# Patient Record
Sex: Male | Born: 1975 | Race: Black or African American | Hispanic: No | Marital: Single | Smoking: Current every day smoker
Health system: Southern US, Community
[De-identification: ages and names within clinical notes are randomized; demographics above are authoritative.]

## PROBLEM LIST (undated history)

## (undated) DIAGNOSIS — R55 Syncope and collapse: Secondary | ICD-10-CM

---

## 2004-11-04 ENCOUNTER — Emergency Department (HOSPITAL_COMMUNITY): Admission: EM | Admit: 2004-11-04 | Discharge: 2004-11-04 | Payer: Self-pay | Admitting: Family Medicine

## 2005-03-05 ENCOUNTER — Emergency Department (HOSPITAL_COMMUNITY): Admission: EM | Admit: 2005-03-05 | Discharge: 2005-03-05 | Payer: Self-pay | Admitting: Emergency Medicine

## 2005-03-06 ENCOUNTER — Emergency Department (HOSPITAL_COMMUNITY): Admission: EM | Admit: 2005-03-06 | Discharge: 2005-03-07 | Payer: Self-pay | Admitting: *Deleted

## 2007-08-25 ENCOUNTER — Encounter: Admission: RE | Admit: 2007-08-25 | Discharge: 2007-08-25 | Payer: Self-pay | Admitting: Occupational Medicine

## 2017-07-18 ENCOUNTER — Observation Stay (HOSPITAL_COMMUNITY)
Admission: EM | Admit: 2017-07-18 | Discharge: 2017-07-19 | Disposition: A | Discharge status: Home / Self Care | Payer: PRIVATE HEALTH INSURANCE | Attending: Family Medicine | Admitting: Family Medicine

## 2017-07-18 ENCOUNTER — Encounter (HOSPITAL_COMMUNITY): Payer: Self-pay

## 2017-07-18 ENCOUNTER — Other Ambulatory Visit: Payer: Self-pay

## 2017-07-18 DIAGNOSIS — Z791 Long term (current) use of non-steroidal anti-inflammatories (NSAID): Secondary | ICD-10-CM | POA: Diagnosis not present

## 2017-07-18 DIAGNOSIS — K259 Gastric ulcer, unspecified as acute or chronic, without hemorrhage or perforation: Secondary | ICD-10-CM | POA: Insufficient documentation

## 2017-07-18 DIAGNOSIS — K269 Duodenal ulcer, unspecified as acute or chronic, without hemorrhage or perforation: Secondary | ICD-10-CM | POA: Diagnosis not present

## 2017-07-18 DIAGNOSIS — B9681 Helicobacter pylori [H. pylori] as the cause of diseases classified elsewhere: Secondary | ICD-10-CM | POA: Insufficient documentation

## 2017-07-18 DIAGNOSIS — F1010 Alcohol abuse, uncomplicated: Secondary | ICD-10-CM

## 2017-07-18 DIAGNOSIS — K5730 Diverticulosis of large intestine without perforation or abscess without bleeding: Secondary | ICD-10-CM | POA: Insufficient documentation

## 2017-07-18 DIAGNOSIS — K922 Gastrointestinal hemorrhage, unspecified: Principal | ICD-10-CM

## 2017-07-18 DIAGNOSIS — K648 Other hemorrhoids: Secondary | ICD-10-CM | POA: Insufficient documentation

## 2017-07-18 DIAGNOSIS — F17210 Nicotine dependence, cigarettes, uncomplicated: Secondary | ICD-10-CM | POA: Insufficient documentation

## 2017-07-18 DIAGNOSIS — K2970 Gastritis, unspecified, without bleeding: Secondary | ICD-10-CM | POA: Diagnosis not present

## 2017-07-18 DIAGNOSIS — K2950 Unspecified chronic gastritis without bleeding: Secondary | ICD-10-CM | POA: Insufficient documentation

## 2017-07-18 DIAGNOSIS — D649 Anemia, unspecified: Secondary | ICD-10-CM | POA: Diagnosis not present

## 2017-07-18 DIAGNOSIS — K449 Diaphragmatic hernia without obstruction or gangrene: Secondary | ICD-10-CM | POA: Insufficient documentation

## 2017-07-18 DIAGNOSIS — Z720 Tobacco use: Secondary | ICD-10-CM | POA: Diagnosis not present

## 2017-07-18 DIAGNOSIS — D509 Iron deficiency anemia, unspecified: Secondary | ICD-10-CM | POA: Diagnosis not present

## 2017-07-18 DIAGNOSIS — R55 Syncope and collapse: Secondary | ICD-10-CM | POA: Diagnosis present

## 2017-07-18 HISTORY — DX: Syncope and collapse: R55

## 2017-07-18 LAB — IRON AND TIBC
Iron: 12 ug/dL — ABNORMAL LOW (ref 45–182)
SATURATION RATIOS: 2 % — AB (ref 17.9–39.5)
TIBC: 567 ug/dL — ABNORMAL HIGH (ref 250–450)
UIBC: 555 ug/dL

## 2017-07-18 LAB — BASIC METABOLIC PANEL
ANION GAP: 16 — AB (ref 5–15)
BUN: 6 mg/dL (ref 6–20)
CO2: 19 mmol/L — ABNORMAL LOW (ref 22–32)
Calcium: 8.7 mg/dL — ABNORMAL LOW (ref 8.9–10.3)
Chloride: 105 mmol/L (ref 101–111)
Creatinine, Ser: 0.87 mg/dL (ref 0.61–1.24)
GFR calc Af Amer: >60 mL/min (ref >60)
Glucose, Bld: 111 mg/dL — ABNORMAL HIGH (ref 65–99)
Potassium: 3.6 mmol/L (ref 3.5–5.1)
SODIUM: 140 mmol/L (ref 135–145)

## 2017-07-18 LAB — RETICULOCYTES
RBC.: 4.19 MIL/uL — AB (ref 4.22–5.81)
Retic Count, Absolute: 41.9 10*3/uL (ref 19.0–186.0)
Retic Ct Pct: 1 % (ref 0.4–3.1)

## 2017-07-18 LAB — URINALYSIS, ROUTINE W REFLEX MICROSCOPIC
BACTERIA UA: NONE SEEN
BILIRUBIN URINE: NEGATIVE
GLUCOSE, UA: NEGATIVE mg/dL
Hgb urine dipstick: NEGATIVE
KETONES UR: NEGATIVE mg/dL
LEUKOCYTES UA: NEGATIVE
NITRITE: NEGATIVE
Protein, ur: 100 mg/dL — AB
Specific Gravity, Urine: 1.015 (ref 1.005–1.030)
pH: 5 (ref 5.0–8.0)

## 2017-07-18 LAB — POC OCCULT BLOOD, ED: Fecal Occult Bld: POSITIVE — AB

## 2017-07-18 LAB — RAPID URINE DRUG SCREEN, HOSP PERFORMED
Amphetamines: NOT DETECTED
Barbiturates: NOT DETECTED
Benzodiazepines: NOT DETECTED
COCAINE: NOT DETECTED
OPIATES: NOT DETECTED
TETRAHYDROCANNABINOL: NOT DETECTED

## 2017-07-18 LAB — CBC
HEMATOCRIT: 25.2 % — AB (ref 39.0–52.0)
HEMOGLOBIN: 7.3 g/dL — AB (ref 13.0–17.0)
MCH: 18.1 pg — ABNORMAL LOW (ref 26.0–34.0)
MCHC: 29 g/dL — ABNORMAL LOW (ref 30.0–36.0)
MCV: 62.4 fL — ABNORMAL LOW (ref 78.0–100.0)
Platelets: 252 10*3/uL (ref 150–400)
RBC: 4.04 MIL/uL — ABNORMAL LOW (ref 4.22–5.81)
RDW: 19.9 % — ABNORMAL HIGH (ref 11.5–15.5)
WBC: 5.5 10*3/uL (ref 4.0–10.5)

## 2017-07-18 LAB — PREPARE RBC (CROSSMATCH)

## 2017-07-18 LAB — HEMOGLOBIN AND HEMATOCRIT, BLOOD
HCT: 26.3 % — ABNORMAL LOW (ref 39.0–52.0)
Hemoglobin: 8 g/dL — ABNORMAL LOW (ref 13.0–17.0)

## 2017-07-18 LAB — PROTIME-INR
INR: 1.05
Prothrombin Time: 13.6 seconds (ref 11.4–15.2)

## 2017-07-18 LAB — VITAMIN B12: Vitamin B-12: 357 pg/mL (ref 180–914)

## 2017-07-18 LAB — FOLATE: Folate: 13.2 ng/mL (ref >5.9)

## 2017-07-18 LAB — FERRITIN: Ferritin: 5 ng/mL — ABNORMAL LOW (ref 24–336)

## 2017-07-18 MED ORDER — ACETAMINOPHEN 325 MG PO TABS: 650.0000 mg | ORAL_TABLET | Freq: Four times a day (QID) | ORAL | Status: DC | PRN

## 2017-07-18 MED ORDER — SODIUM CHLORIDE 0.9 % IV SOLN
INTRAVENOUS | Status: DC
  Administered 2017-07-18: 22:00:00 via INTRAVENOUS

## 2017-07-18 MED ORDER — PEG-KCL-NACL-NASULF-NA ASC-C 100 G PO SOLR
0.5000 | Freq: Once | ORAL | Status: AC
  Administered 2017-07-18: 100 g via ORAL
  Filled 2017-07-18: qty 1

## 2017-07-18 MED ORDER — THIAMINE HCL 100 MG/ML IJ SOLN: 100.0000 mg | Freq: Every day | INTRAMUSCULAR | Status: DC

## 2017-07-18 MED ORDER — PEG-KCL-NACL-NASULF-NA ASC-C 100 G PO SOLR
0.5000 | Freq: Once | ORAL | Status: AC
  Administered 2017-07-19: 100 g via ORAL
  Filled 2017-07-18: qty 1

## 2017-07-18 MED ORDER — VITAMIN B-1 100 MG PO TABS
100.0000 mg | ORAL_TABLET | Freq: Every day | ORAL | Status: DC
  Administered 2017-07-18 – 2017-07-19 (×2): 100 mg via ORAL
  Filled 2017-07-18 (×2): qty 1

## 2017-07-18 MED ORDER — SODIUM CHLORIDE 0.9 % IV SOLN: Freq: Once | INTRAVENOUS | Status: DC

## 2017-07-18 MED ORDER — FOLIC ACID 1 MG PO TABS
1.0000 mg | ORAL_TABLET | Freq: Every day | ORAL | Status: DC
  Administered 2017-07-18 – 2017-07-19 (×2): 1 mg via ORAL
  Filled 2017-07-18 (×2): qty 1

## 2017-07-18 MED ORDER — LORAZEPAM 2 MG/ML IJ SOLN: 1.0000 mg | Freq: Four times a day (QID) | INTRAMUSCULAR | Status: DC | PRN

## 2017-07-18 MED ORDER — PEG-KCL-NACL-NASULF-NA ASC-C 100 G PO SOLR: 1.0000 | Freq: Once | ORAL | Status: DC

## 2017-07-18 MED ORDER — LORAZEPAM 1 MG PO TABS: 1.0000 mg | ORAL_TABLET | Freq: Four times a day (QID) | ORAL | Status: DC | PRN

## 2017-07-18 MED ORDER — NICOTINE 14 MG/24HR TD PT24
14.0000 mg | MEDICATED_PATCH | Freq: Every day | TRANSDERMAL | Status: DC
  Administered 2017-07-19 (×2): 14 mg via TRANSDERMAL
  Filled 2017-07-18 (×2): qty 1

## 2017-07-18 MED ORDER — ACETAMINOPHEN 650 MG RE SUPP: 650.0000 mg | Freq: Four times a day (QID) | RECTAL | Status: DC | PRN

## 2017-07-18 MED ORDER — ADULT MULTIVITAMIN W/MINERALS CH
1.0000 | ORAL_TABLET | Freq: Every day | ORAL | Status: DC
  Administered 2017-07-18 – 2017-07-19 (×2): 1 via ORAL
  Filled 2017-07-18 (×2): qty 1

## 2017-07-18 MED ORDER — ONDANSETRON HCL 4 MG PO TABS: 4.0000 mg | ORAL_TABLET | Freq: Four times a day (QID) | ORAL | Status: DC | PRN

## 2017-07-18 MED ORDER — ONDANSETRON HCL 4 MG/2ML IJ SOLN: 4.0000 mg | Freq: Four times a day (QID) | INTRAMUSCULAR | Status: DC | PRN

## 2017-07-18 NOTE — ED Provider Notes (Signed)
[facility] Provider Note   CSN: [id number] Arrival date & time: 07/18/17  [id number]     History   Chief Complaint Chief Complaint  Patient presents with  . Nausea  . Dizziness    HPI Shane Hicks is a 42 y.o. male.  The history is provided by the patient and medical records. No language interpreter was used.  Dizziness  Associated symptoms: nausea and weakness   Associated symptoms: no diarrhea, no headaches and no vomiting    Shane Hicks is a 42 y.o. male  with no known PMH who presents to the Emergency Department complaining of lightheadedness which began this morning. Patient states that he stood up and got very weak and lightheaded. He thought he was going to faint, so he sat back down. Associated with nausea which has now resolved. Denies abdominal pain or vomiting. Still feels "like something's not right" and endorses fatigue and lightheadedness but denies any weakness / dizziness at the current moment.  He does report noticing bright red blood in the stool.  He states that this is been very intermittent, maybe once every 2-3 weeks.  He does not feel as if he has had any blood in the stool this week. No hx of similar sxs. Denies hx of anemia or any known medical problems. Does not currently have PCP. No fever, chills, headache, neck pain, chest pain, shortness of breath. No medications taken prior to arrival for symptoms.    Past Medical History:  Diagnosis Date  . Pre-syncope     Patient Active Problem List   Diagnosis Date Noted  . Acute anemia 07/18/2017  . Pre-syncope     History reviewed. No pertinent surgical history.      Home Medications    Prior to Admission medications   Not on File    Family History No family history on file.  Social History Social History   Tobacco Use  . Smoking status: Current Every Day Smoker    Packs/day: 1.00    Types: Cigarettes  . Smokeless tobacco: Never Used  Substance Use  Topics  . Alcohol use: Yes    Comment: pt reports last use last night   . Drug use: Not Currently     Allergies   Patient has no allergy information on record.   Review of Systems Review of Systems  Constitutional: Positive for fatigue.  Gastrointestinal: Positive for nausea. Negative for abdominal pain, diarrhea and vomiting.  Neurological: Positive for weakness and light-headedness. Negative for syncope, numbness and headaches.  All other systems reviewed and are negative.    Physical Exam Updated Vital Signs BP (!) 141/88 (BP Location: Right Arm)   Pulse 85   Temp 98.1 F (36.7 C) (Oral)   Resp 16   Ht 6' (1.829 m)   Wt 80.3 kg (177 lb)   SpO2 100%   BMI 24.01 kg/m   Physical Exam  Constitutional: He is oriented to person, place, and time. He appears well-developed and well-nourished. No distress.  HENT:  Head: Normocephalic and atraumatic.  Cardiovascular: Normal rate, regular rhythm and normal heart sounds.  No murmur heard. Pulmonary/Chest: Effort normal and breath sounds normal. No respiratory distress.  Abdominal: Soft. He exhibits no distension.  No abdominal tenderness.  Genitourinary:  Genitourinary Comments: Guaiac positive.  Bright red blood on rectal exam.  Musculoskeletal: Normal range of motion.  Neurological: He is alert and oriented to person, place, and time.  Speech clear and goal oriented. CN 2-12 grossly  intact. Normal finger-to-nose and rapid alternating movements. No drift. Strength and sensation intact. Steady gait.  Skin: Skin is warm and dry.  Nursing note and vitals reviewed.    ED Treatments / Results  Labs (all labs ordered are listed, but only abnormal results are displayed) Labs Reviewed  BASIC METABOLIC PANEL - Abnormal; Notable for the following components:      Result Value   CO2 19 (*)    Glucose, Bld 111 (*)    Calcium 8.7 (*)    Anion gap 16 (*)    All other components within normal limits  CBC - Abnormal; Notable  for the following components:   RBC 4.04 (*)    Hemoglobin 7.3 (*)    HCT 25.2 (*)    MCV 62.4 (*)    MCH 18.1 (*)    MCHC 29.0 (*)    RDW 19.9 (*)    All other components within normal limits  URINALYSIS, ROUTINE W REFLEX MICROSCOPIC - Abnormal; Notable for the following components:   APPearance HAZY (*)    Protein, ur 100 (*)    Squamous Epithelial / LPF 0-5 (*)    All other components within normal limits  RETICULOCYTES - Abnormal; Notable for the following components:   RBC. 4.19 (*)    All other components within normal limits  POC OCCULT BLOOD, ED - Abnormal; Notable for the following components:   Fecal Occult Bld POSITIVE (*)    All other components within normal limits  VITAMIN B12  FOLATE  IRON AND TIBC  FERRITIN  TYPE AND SCREEN    EKG EKG Interpretation  Date/Time:  Saturday July 18 2017 10:10:56 EDT Ventricular Rate:  83 PR Interval:  150 QRS Duration: 92 QT Interval:  384 QTC Calculation: 451 R Axis:   70 Text Interpretation:  Normal sinus rhythm Right atrial enlargement Minimal voltage criteria for LVH, may be normal variant Borderline ECG No significant change since last tracing Confirmed by Alvira Monday ([id number]) on 07/18/2017 1:47:55 PM   Radiology No results found.  Procedures Procedures (including critical care time)  Medications Ordered in ED Medications - No data to display   Initial Impression / Assessment and Plan / ED Course  I have reviewed the triage vital signs and the nursing notes.  Pertinent labs & imaging results that were available during my care of the patient were reviewed by me and considered in my medical decision making (see chart for details).    Shane Hicks is a 42 y.o. male who presents to ED for near syncopal episode this morning upon awakening.  He felt very lightheaded this morning when he got up.  He sat back down and symptoms improved gradually.  He still feels as if something is not right and somewhat  lightheaded, but overall improved.  Afebrile and hemodynamically stable.  He did have bright red blood on rectal examination which was guaiac positive.  He notes history of bright red blood in the stools intermittently, maybe once every 2-3 weeks.  He did not realize he was having active bleeding at this time.  Labs reviewed and notable for hemoglobin of 7.3.  No baseline for comparison.  No history of similar per patient.  GI, Dr. Adela Lank, consulted. GI to evaluate and see in consultation.  Hospitalist consulted who will admit.  Patient discussed with Dr. Dalene Seltzer who agrees with treatment plan.    Final Clinical Impressions(s) / ED Diagnoses   Final diagnoses:  Symptomatic anemia    ED Discharge  Orders    None       Yanna Leaks, Chase Picket, PA-C 07/18/17 1444    Alvira Monday, MD 07/18/17 2328

## 2017-07-18 NOTE — ED Notes (Signed)
tele

## 2017-07-18 NOTE — ED Notes (Signed)
Attempted to call report to floor 

## 2017-07-18 NOTE — H&P (Signed)
History and Physical    Shane Hicks ZOX:[id number] DOB: 02-May-1975 DOA: 07/18/2017  PCP: Patient, No Pcp Per Patient coming from: home  Chief Complaint: dizzy near syncope  HPI: Shane Hicks is a 42 y.o. male with medical history significant  For EtOH use tobacco use chronic headaches for which he uses goody powders presents emergency Department chief complaint dizziness and near syncope. Initial evaluation reveals hemoglobin of 7.2 and heme positive stool. [facility] are asked to admit  Information is obtained from the patient and the chart. He reports being in his usual state of health until he awakened today and upon getting out of bed he became "dizzy". He denies headache chest pain shortness of breath palpitations. Reports he felt like "he was given a pass out". Denies any abdominal pain nausea vomiting diarrhea constipation melena bright red blood per rectum. He does report he takes  powders through 4 times a week for frequent headaches and he drinks at least 6 12 ounce beers a day. He denies any fever chills recent travel or sick contacts. He denies any unintentional weight loss. He states he's never had a colonoscopy or an endoscopy. No history of peptic ulcer disease.   ED Course: in the emergency department he's afebrile mildly orthostatic not hypoxic.  Review of Systems: As per HPI otherwise all other systems reviewed and are negative.   Ambulatory Status:ambulates independently is independent with ADLs  Past Medical History:  Diagnosis Date  . Pre-syncope     History reviewed. No pertinent surgical history.  Social History   Socioeconomic History  . Marital status: Single    Spouse name: Not on file  . Number of children: Not on file  . Years of education: Not on file  . Highest education level: Not on file  Occupational History  . Not on file  Social Needs  . Financial resource strain: Not on file  . Food insecurity:    Worry: Not on file    Inability:  Not on file  . Transportation needs:    Medical: Not on file    Non-medical: Not on file  Tobacco Use  . Smoking status: Current Every Day Smoker    Packs/day: 1.00    Types: Cigarettes  . Smokeless tobacco: Never Used  Substance and Sexual Activity  . Alcohol use: Yes    Comment: pt reports last use last night   . Drug use: Not Currently  . Sexual activity: Not on file  Lifestyle  . Physical activity:    Days per week: Not on file    Minutes per session: Not on file  . Stress: Not on file  Relationships  . Social connections:    Talks on phone: Not on file    Gets together: Not on file    Attends religious service: Not on file    Active member of club or organization: Not on file    Attends meetings of clubs or organizations: Not on file    Relationship status: Not on file  . Intimate partner violence:    Fear of current or ex partner: Not on file    Emotionally abused: Not on file    Physically abused: Not on file    Forced sexual activity: Not on file  Other Topics Concern  . Not on file  Social History Narrative  . Not on file    No Known Allergies  Family History  Problem Relation Age of Onset  . Hypertension Mother  Prior to Admission medications   Medication Sig Start Date End Date Taking? Authorizing Provider  Aspirin-Salicylamide-Caffeine (BC HEADACHE POWDER PO) Take 2 packets by mouth 2 (two) times daily as needed (pain/headache).   Yes [provider]    Physical Exam: Vitals:   07/18/17 1013 07/18/17 1016  BP: (!) 141/88   Pulse: 85   Resp: 16   Temp: 98.1 F (36.7 C)   TempSrc: Oral   SpO2: 100%   Weight: 80.3 kg (177 lb)   Height: 6' (1.829 m) 6' (1.829 m)     General:  Appears calm and comfortable Eyes:  PERRL, EOMI, normal lids, iris ENT:  grossly normal hearing, lips & tongue, mucous membranes of his mouth are moist and pink Neck:  no LAD, masses or thyromegaly Cardiovascular:  RRR, no m/r/g. No LE edema.  Respiratory:   CTA bilaterally, no w/r/r. Normal respiratory effort. Abdomen:  soft, ntnd, ositive bowel sounds throughout no guarding or rebounding Skin:  no rash or induration seen on limited exam  Musculoskeletal:  grossly normal tone BUE/BLE, good ROM, no bony abnormality Psychiatric:  grossly normal mood and affect, speech fluent and appropriate, AOx3 Neurologic:  CN 2-12 grossly intact, moves all extremities in coordinated fashion, sensation intact  Labs on Admission: I have personally reviewed following labs and imaging studies  CBC: Recent Labs  Lab 07/18/17 1018  WBC 5.5  HGB 7.3*  HCT 25.2*  MCV 62.4*  PLT 252   Basic Metabolic Panel: Recent Labs  Lab 07/18/17 1018  NA 140  K 3.6  CL 105  CO2 19*  GLUCOSE 111*  BUN 6  CREATININE 0.87  CALCIUM 8.7*   GFR: Estimated Creatinine Clearance: 121.4 mL/min (by C-G formula based on SCr of 0.87 mg/dL). Liver Function Tests: No results for input(s): AST, ALT, ALKPHOS, BILITOT, PROT, ALBUMIN in the last 168 hours. No results for input(s): LIPASE, AMYLASE in the last 168 hours. No results for input(s): AMMONIA in the last 168 hours. Coagulation Profile: Recent Labs  Lab 07/18/17 1419  INR 1.05   Cardiac Enzymes: No results for input(s): CKTOTAL, CKMB, CKMBINDEX, TROPONINI in the last 168 hours. BNP (last 3 results) No results for input(s): PROBNP in the last 8760 hours. HbA1C: No results for input(s): HGBA1C in the last 72 hours. CBG: No results for input(s): GLUCAP in the last 168 hours. Lipid Profile: No results for input(s): CHOL, HDL, LDLCALC, TRIG, CHOLHDL, LDLDIRECT in the last 72 hours. Thyroid Function Tests: No results for input(s): TSH, T4TOTAL, FREET4, T3FREE, THYROIDAB in the last 72 hours. Anemia Panel: Recent Labs    07/18/17 1220  VITAMINB12 357  FOLATE 13.2  FERRITIN 5*  TIBC 567*  IRON 12*  RETICCTPCT 1.0   Urine analysis:    Component Value Date/Time   COLORURINE YELLOW 07/18/2017 1018    APPEARANCEUR HAZY (A) 07/18/2017 1018   LABSPEC 1.015 07/18/2017 1018   PHURINE 5.0 07/18/2017 1018   GLUCOSEU NEGATIVE 07/18/2017 1018   HGBUR NEGATIVE 07/18/2017 1018   BILIRUBINUR NEGATIVE 07/18/2017 1018   KETONESUR NEGATIVE 07/18/2017 1018   PROTEINUR 100 (A) 07/18/2017 1018   NITRITE NEGATIVE 07/18/2017 1018   LEUKOCYTESUR NEGATIVE 07/18/2017 1018    Creatinine Clearance: Estimated Creatinine Clearance: 121.4 mL/min (by C-G formula based on SCr of 0.87 mg/dL).  Sepsis Labs: @LABRCNTIP (procalcitonin:4,lacticidven:4) )No results found for this or any previous visit (from the past 240 hour(s)).   Radiological Exams on Admission: No results found.  EKG: Normal sinus rhythm Right atrial enlargement Minimal  voltage criteria for LVH, may be normal variant   Assessment/Plan Principal Problem:   GI bleed Active Problems:   Pre-syncope   Symptomatic anemia   Tobacco use   ETOH abuse   #1. GI bleed. Patient takes Marlin CanaryGoody powders 2-3 times a week on a regular basis also daily EtOH consumption. Heme positive stool. Hemoglobin 7.3. Evaluated by gastroenterology who opined patient needs to be evaluated for erosive disease. -admit -clear liquids -eGD and colonoscopy per GI tomorrow -Transfuse 1 unit packed red blood cells  #2. Symptomatic anemia. Hemoglobin 7.2. Patient with severe iron deficiency anemia and heme positive stools. -Transfuse 1 unit of packed red blood cells -Serial CBCs -See #1  #3. Presyncope. Likely related to above. Patient mildly orthostatic in the emergency department as well. EKG as noted above. -gentle IV fluids -Monitor on telemetry -Orthostatic vital signs tomorrow -Transfuse as noted above  #4. EtOH use. Patient states he drinks at least 6 beers a day. -CIWA protocol -No signs symptoms of withdrawal  #5. Tobacco use -Cessation counseling offered    DVT prophylaxis:  scd Code Status: full  Family Communication: wife at bedside  Disposition  Plan: home  Consults called: [facility]  Admission status: inpatient    Gwenyth BenderBLACK,Alauna Hayden M MD [facility]  If 7PM-7AM, please contact night-coverage [email or web address] Password [email or web address]  07/18/2017, 4:13 PM

## 2017-07-18 NOTE — ED Triage Notes (Signed)
Pt reports he woke up with dizziness and nausea this morning and "something not feeling right. Pt also states that he removed a tick yesterday .

## 2017-07-18 NOTE — Consult Note (Signed)
Referring Provider: [facility]   Primary Care Physician:  Patient, No Pcp Per Primary Gastroenterologist:   None. Unassigned  Reason for Consultation:  Rectal bleeding, anemia    ASSESSMENT AND PLAN:    97.  42 year old male with severe iron deficiency anemia /heme positive stool.  He takes [place] powders 2-3 times a week on a regular basis and has done so for years.  Need to evaluate for NSAID induced erosive disease / peptic ulcer disease.  Additionally, patient has had a few episodes of low volume rectal bleeding with bowel movements over the last 2 to 3 months.  The degree of anemia is out of proportion to the amount of described rectal bleeding.  Bleeding could be from internal hemorrhoids but certainly patient needs a colonoscopy to rule out neoplasm -Patient will be scheduled for an EGD and colonoscopy with possible biopsies.  The risk and benefits of each procedure were explained to the patient and he agrees to proceed.  Will prep patient tonight for a.m. procedures -Blood transfusion has already been ordered  2. ETOH use / ? Overuse. Drinks 1/2 of a 12 pk daily.  -check LFTs next lab draw  HPI: Shane Hicks is a 42 y.o. male who presented to the ED today with dizziness.  He was hemodynamically stable but labs have identified severe iron deficiency anemia.  Patient reported rectal bleeding over the last couple of months.  Upon further questioning he has had maybe 3 episodes of what sounds like low-volume painless rectal bleeding with defecation in the absence of constipation or diarrhea.  His bowel movements are otherwise at baseline.  He has no abdominal pain.  He reported some nausea this morning but this is not been his norm.  He denies weight loss.  Patient has taken Goody powders 2-3 times a week for years for headaches.  No other NSAIDs.  No history of peptic ulcer disease.  He has no chronic GI complaints or illnesses.  No family history of stomach cancer or colon cancer.     Patient smokes tobacco.  He drinks half of a 12 pack daily.  No illicit drug use.  No marijuana use.  Evaluation: WBC 5.5, hemoglobin 7.3, platelets 252, ferritin 5. Stool heme positive     Past Medical History:  Diagnosis Date  . Pre-syncope     History reviewed. No pertinent surgical history.  Prior to Admission medications   Medication Sig Start Date End Date Taking? Authorizing Provider  Aspirin-Salicylamide-Caffeine (BC HEADACHE POWDER PO) Take 2 packets by mouth 2 (two) times daily as needed (pain/headache).   Yes [provider]    Current Facility-Administered Medications  Medication Dose Route Frequency Provider Last Rate Last Dose  . 0.9 %  sodium chloride infusion   Intravenous Continuous Black, Karen M, NP      . 0.9 %  sodium chloride infusion   Intravenous Once Black, Lesle Chris, NP      . acetaminophen (TYLENOL) tablet 650 mg  650 mg Oral Q6H PRN Gwenyth Bender, NP       Or  . acetaminophen (TYLENOL) suppository 650 mg  650 mg Rectal Q6H PRN Gwenyth Bender, NP      . ondansetron [facility]) tablet 4 mg  4 mg Oral Q6H PRN Gwenyth Bender, NP       Or  . ondansetron [facility]) injection 4 mg  4 mg Intravenous Q6H PRN Gwenyth Bender, NP       Current Outpatient Medications  Medication Sig Dispense Refill  . Aspirin-Salicylamide-Caffeine (BC HEADACHE POWDER PO) Take 2 packets by mouth 2 (two) times daily as needed (pain/headache).      Allergies as of 07/18/2017  . (No Known Allergies)    No family history on file.  Social History   Socioeconomic History  . Marital status: Single    Spouse name: Not on file  . Number of children: Not on file  . Years of education: Not on file  . Highest education level: Not on file  Occupational History  . Not on file  Social Needs  . Financial resource strain: Not on file  . Food insecurity:    Worry: Not on file    Inability: Not on file  . Transportation needs:    Medical: Not on file    Non-medical: Not on  file  Tobacco Use  . Smoking status: Current Every Day Smoker    Packs/day: 1.00    Types: Cigarettes  . Smokeless tobacco: Never Used  Substance and Sexual Activity  . Alcohol use: Yes    Comment: pt reports last use last night   . Drug use: Not Currently  . Sexual activity: Not on file  Lifestyle  . Physical activity:    Days per week: Not on file    Minutes per session: Not on file  . Stress: Not on file  Relationships  . Social connections:    Talks on phone: Not on file    Gets together: Not on file    Attends religious service: Not on file    Active member of club or organization: Not on file    Attends meetings of clubs or organizations: Not on file    Relationship status: Not on file  . Intimate partner violence:    Fear of current or ex partner: Not on file    Emotionally abused: Not on file    Physically abused: Not on file    Forced sexual activity: Not on file  Other Topics Concern  . Not on file  Social History Narrative  . Not on file    Review of Systems: All systems reviewed and negative except where noted in HPI.  Physical Exam: Vital signs in last 24 hours: Temp:  [98.1 F (36.7 C)] 98.1 F (36.7 C) (04/20 1013) Pulse Rate:  [85] 85 (04/20 1013) Resp:  [16] 16 (04/20 1013) BP: (141)/(88) 141/88 (04/20 1013) SpO2:  [100 %] 100 % (04/20 1013) Weight:  [177 lb (80.3 kg)] 177 lb (80.3 kg) (04/20 1013)   General:   Alert, thin black male in NAD Psych:  Pleasant, cooperative. Normal mood and affect. Eyes:  Pupils equal, sclera clear, no icterus.   Conjunctiva pink. Ears:  Normal auditory acuity. Nose:  No deformity, discharge,  or lesions. Neck:  Supple; no masses Lungs:  Clear throughout to auscultation.   No wheezes, crackles, or rhonchi.  Heart:  Regular rate and rhythm; no murmurs, no edema Abdomen:  Soft, non-distended, nontender, BS active, no palp mass    Rectal:  Deferred until time of colonoscopy Msk:  Symmetrical without gross  deformities. . Neurologic:  Alert and  oriented x4;  grossly normal neurologically. Skin:  Intact without significant lesions or rashes..   Intake/Output from previous day: No intake/output data recorded. Intake/Output this shift: No intake/output data recorded.  Lab Results: Recent Labs    07/18/17 1018  WBC 5.5  HGB 7.3*  HCT 25.2*  PLT 252   BMET Recent Labs  07/18/17 1018  NA 140  K 3.6  CL 105  CO2 19*  GLUCOSE 111*  BUN 6  CREATININE 0.87  CALCIUM 8.7*    Studies/Results: No results found.   Willette Cluster, NP-C @  07/18/2017, 2:57 PM  Pager number [phone]

## 2017-07-18 NOTE — ED Notes (Signed)
Ordered dinner tray.  

## 2017-07-18 NOTE — ED Notes (Signed)
Pt ambulated to restroom. Gait steady.  

## 2017-07-18 NOTE — ED Triage Notes (Signed)
This Clinical research associatewriter observed PT walking down hall. PT asked where he was going. PT reported he had to go to his car.

## 2017-07-18 NOTE — H&P (View-Only) (Signed)
Referring Provider: [facility]   Primary Care Physician:  Patient, No Pcp Per Primary Gastroenterologist:   None. Unassigned  Reason for Consultation:  Rectal bleeding, anemia    ASSESSMENT AND PLAN:    97.  42 year old male with severe iron deficiency anemia /heme positive stool.  He takes [place] powders 2-3 times a week on a regular basis and has done so for years.  Need to evaluate for NSAID induced erosive disease / peptic ulcer disease.  Additionally, patient has had a few episodes of low volume rectal bleeding with bowel movements over the last 2 to 3 months.  The degree of anemia is out of proportion to the amount of described rectal bleeding.  Bleeding could be from internal hemorrhoids but certainly patient needs a colonoscopy to rule out neoplasm -Patient will be scheduled for an EGD and colonoscopy with possible biopsies.  The risk and benefits of each procedure were explained to the patient and he agrees to proceed.  Will prep patient tonight for a.m. procedures -Blood transfusion has already been ordered  2. ETOH use / ? Overuse. Drinks 1/2 of a 12 pk daily.  -check LFTs next lab draw  HPI: Catalino Plascencia is a 42 y.o. male who presented to the ED today with dizziness.  He was hemodynamically stable but labs have identified severe iron deficiency anemia.  Patient reported rectal bleeding over the last couple of months.  Upon further questioning he has had maybe 3 episodes of what sounds like low-volume painless rectal bleeding with defecation in the absence of constipation or diarrhea.  His bowel movements are otherwise at baseline.  He has no abdominal pain.  He reported some nausea this morning but this is not been his norm.  He denies weight loss.  Patient has taken Goody powders 2-3 times a week for years for headaches.  No other NSAIDs.  No history of peptic ulcer disease.  He has no chronic GI complaints or illnesses.  No family history of stomach cancer or colon cancer.     Patient smokes tobacco.  He drinks half of a 12 pack daily.  No illicit drug use.  No marijuana use.  Evaluation: WBC 5.5, hemoglobin 7.3, platelets 252, ferritin 5. Stool heme positive     Past Medical History:  Diagnosis Date  . Pre-syncope     History reviewed. No pertinent surgical history.  Prior to Admission medications   Medication Sig Start Date End Date Taking? Authorizing Provider  Aspirin-Salicylamide-Caffeine (BC HEADACHE POWDER PO) Take 2 packets by mouth 2 (two) times daily as needed (pain/headache).   Yes [provider]    Current Facility-Administered Medications  Medication Dose Route Frequency Provider Last Rate Last Dose  . 0.9 %  sodium chloride infusion   Intravenous Continuous Black, Karen M, NP      . 0.9 %  sodium chloride infusion   Intravenous Once Black, Lesle Chris, NP      . acetaminophen (TYLENOL) tablet 650 mg  650 mg Oral Q6H PRN Gwenyth Bender, NP       Or  . acetaminophen (TYLENOL) suppository 650 mg  650 mg Rectal Q6H PRN Gwenyth Bender, NP      . ondansetron [facility]) tablet 4 mg  4 mg Oral Q6H PRN Gwenyth Bender, NP       Or  . ondansetron [facility]) injection 4 mg  4 mg Intravenous Q6H PRN Gwenyth Bender, NP       Current Outpatient Medications  Medication Sig Dispense Refill  . Aspirin-Salicylamide-Caffeine (BC HEADACHE POWDER PO) Take 2 packets by mouth 2 (two) times daily as needed (pain/headache).      Allergies as of 07/18/2017  . (No Known Allergies)    No family history on file.  Social History   Socioeconomic History  . Marital status: Single    Spouse name: Not on file  . Number of children: Not on file  . Years of education: Not on file  . Highest education level: Not on file  Occupational History  . Not on file  Social Needs  . Financial resource strain: Not on file  . Food insecurity:    Worry: Not on file    Inability: Not on file  . Transportation needs:    Medical: Not on file    Non-medical: Not on  file  Tobacco Use  . Smoking status: Current Every Day Smoker    Packs/day: 1.00    Types: Cigarettes  . Smokeless tobacco: Never Used  Substance and Sexual Activity  . Alcohol use: Yes    Comment: pt reports last use last night   . Drug use: Not Currently  . Sexual activity: Not on file  Lifestyle  . Physical activity:    Days per week: Not on file    Minutes per session: Not on file  . Stress: Not on file  Relationships  . Social connections:    Talks on phone: Not on file    Gets together: Not on file    Attends religious service: Not on file    Active member of club or organization: Not on file    Attends meetings of clubs or organizations: Not on file    Relationship status: Not on file  . Intimate partner violence:    Fear of current or ex partner: Not on file    Emotionally abused: Not on file    Physically abused: Not on file    Forced sexual activity: Not on file  Other Topics Concern  . Not on file  Social History Narrative  . Not on file    Review of Systems: All systems reviewed and negative except where noted in HPI.  Physical Exam: Vital signs in last 24 hours: Temp:  [98.1 F (36.7 C)] 98.1 F (36.7 C) (04/20 1013) Pulse Rate:  [85] 85 (04/20 1013) Resp:  [16] 16 (04/20 1013) BP: (141)/(88) 141/88 (04/20 1013) SpO2:  [100 %] 100 % (04/20 1013) Weight:  [177 lb (80.3 kg)] 177 lb (80.3 kg) (04/20 1013)   General:   Alert, thin black male in NAD Psych:  Pleasant, cooperative. Normal mood and affect. Eyes:  Pupils equal, sclera clear, no icterus.   Conjunctiva pink. Ears:  Normal auditory acuity. Nose:  No deformity, discharge,  or lesions. Neck:  Supple; no masses Lungs:  Clear throughout to auscultation.   No wheezes, crackles, or rhonchi.  Heart:  Regular rate and rhythm; no murmurs, no edema Abdomen:  Soft, non-distended, nontender, BS active, no palp mass    Rectal:  Deferred until time of colonoscopy Msk:  Symmetrical without gross  deformities. . Neurologic:  Alert and  oriented x4;  grossly normal neurologically. Skin:  Intact without significant lesions or rashes..   Intake/Output from previous day: No intake/output data recorded. Intake/Output this shift: No intake/output data recorded.  Lab Results: Recent Labs    07/18/17 1018  WBC 5.5  HGB 7.3*  HCT 25.2*  PLT 252   BMET Recent Labs  07/18/17 1018  NA 140  K 3.6  CL 105  CO2 19*  GLUCOSE 111*  BUN 6  CREATININE 0.87  CALCIUM 8.7*    Studies/Results: No results found.   Willette Cluster, NP-C @  07/18/2017, 2:57 PM  Pager number [phone]

## 2017-07-19 ENCOUNTER — Encounter (HOSPITAL_COMMUNITY)
Admission: EM | Disposition: A | Discharge status: Home / Self Care | Payer: Self-pay | Source: Home / Self Care | Attending: Emergency Medicine

## 2017-07-19 ENCOUNTER — Encounter (HOSPITAL_COMMUNITY): Payer: Self-pay | Admitting: *Deleted

## 2017-07-19 ENCOUNTER — Inpatient Hospital Stay (HOSPITAL_COMMUNITY): Payer: PRIVATE HEALTH INSURANCE | Admitting: Certified Registered Nurse Anesthetist

## 2017-07-19 DIAGNOSIS — K259 Gastric ulcer, unspecified as acute or chronic, without hemorrhage or perforation: Secondary | ICD-10-CM | POA: Diagnosis not present

## 2017-07-19 DIAGNOSIS — K449 Diaphragmatic hernia without obstruction or gangrene: Secondary | ICD-10-CM | POA: Diagnosis not present

## 2017-07-19 DIAGNOSIS — K2970 Gastritis, unspecified, without bleeding: Secondary | ICD-10-CM | POA: Diagnosis not present

## 2017-07-19 DIAGNOSIS — K5730 Diverticulosis of large intestine without perforation or abscess without bleeding: Secondary | ICD-10-CM | POA: Diagnosis not present

## 2017-07-19 DIAGNOSIS — B9681 Helicobacter pylori [H. pylori] as the cause of diseases classified elsewhere: Secondary | ICD-10-CM

## 2017-07-19 DIAGNOSIS — K922 Gastrointestinal hemorrhage, unspecified: Secondary | ICD-10-CM | POA: Diagnosis present

## 2017-07-19 DIAGNOSIS — R55 Syncope and collapse: Secondary | ICD-10-CM

## 2017-07-19 DIAGNOSIS — K648 Other hemorrhoids: Secondary | ICD-10-CM | POA: Diagnosis not present

## 2017-07-19 HISTORY — PX: ESOPHAGOGASTRODUODENOSCOPY: SHX5428

## 2017-07-19 HISTORY — PX: COLONOSCOPY: SHX5424

## 2017-07-19 LAB — TYPE AND SCREEN
ABO/RH(D): A POS
Antibody Screen: NEGATIVE
UNIT DIVISION: 0

## 2017-07-19 LAB — BPAM RBC
BLOOD PRODUCT EXPIRATION DATE: 201905032359
ISSUE DATE / TIME: 201904201711
UNIT TYPE AND RH: 6200

## 2017-07-19 LAB — CBC
HCT: 27 % — ABNORMAL LOW (ref 39.0–52.0)
HEMOGLOBIN: 8.2 g/dL — AB (ref 13.0–17.0)
MCH: 19.6 pg — ABNORMAL LOW (ref 26.0–34.0)
MCHC: 30.4 g/dL (ref 30.0–36.0)
MCV: 64.4 fL — ABNORMAL LOW (ref 78.0–100.0)
Platelets: 227 10*3/uL (ref 150–400)
RBC: 4.19 MIL/uL — ABNORMAL LOW (ref 4.22–5.81)
RDW: 22.5 % — AB (ref 11.5–15.5)
WBC: 4.7 10*3/uL (ref 4.0–10.5)

## 2017-07-19 LAB — COMPREHENSIVE METABOLIC PANEL
ALBUMIN: 3.8 g/dL (ref 3.5–5.0)
ALT: 28 U/L (ref 17–63)
ANION GAP: 12 (ref 5–15)
AST: 33 U/L (ref 15–41)
Alkaline Phosphatase: 44 U/L (ref 38–126)
BUN: 7 mg/dL (ref 6–20)
CO2: 20 mmol/L — AB (ref 22–32)
Calcium: 9.2 mg/dL (ref 8.9–10.3)
Chloride: 109 mmol/L (ref 101–111)
Creatinine, Ser: 0.94 mg/dL (ref 0.61–1.24)
GFR calc Af Amer: >60 mL/min (ref >60)
GFR calc non Af Amer: >60 mL/min (ref >60)
GLUCOSE: 84 mg/dL (ref 65–99)
POTASSIUM: 4.3 mmol/L (ref 3.5–5.1)
Sodium: 141 mmol/L (ref 135–145)
Total Bilirubin: 1.2 mg/dL (ref 0.3–1.2)
Total Protein: 7.6 g/dL (ref 6.5–8.1)

## 2017-07-19 LAB — HIV ANTIBODY (ROUTINE TESTING W REFLEX): HIV SCREEN 4TH GENERATION: NONREACTIVE

## 2017-07-19 SURGERY — EGD (ESOPHAGOGASTRODUODENOSCOPY)
Anesthesia: Monitor Anesthesia Care

## 2017-07-19 MED ORDER — SODIUM CHLORIDE 0.9 % IV SOLN
510.0000 mg | Freq: Once | INTRAVENOUS | Status: AC
  Administered 2017-07-19: 510 mg via INTRAVENOUS
  Filled 2017-07-19: qty 17

## 2017-07-19 MED ORDER — LIDOCAINE 2% (20 MG/ML) 5 ML SYRINGE
INTRAMUSCULAR | Status: DC | PRN
  Administered 2017-07-19: 40 mg via INTRAVENOUS

## 2017-07-19 MED ORDER — FOLIC ACID 1 MG PO TABS: 1.0000 mg | ORAL_TABLET | Freq: Every day | ORAL | 1 refills | Status: DC

## 2017-07-19 MED ORDER — PROPOFOL 10 MG/ML IV BOLUS
INTRAVENOUS | Status: DC | PRN
  Administered 2017-07-19: 40 mg via INTRAVENOUS
  Administered 2017-07-19: 20 mg via INTRAVENOUS
  Administered 2017-07-19 (×2): 40 mg via INTRAVENOUS
  Administered 2017-07-19: 20 mg via INTRAVENOUS

## 2017-07-19 MED ORDER — LACTATED RINGERS IV SOLN
INTRAVENOUS | Status: DC
  Administered 2017-07-19: 12:00:00 via INTRAVENOUS

## 2017-07-19 MED ORDER — ACETAMINOPHEN 325 MG PO TABS: 650.0000 mg | ORAL_TABLET | Freq: Four times a day (QID) | ORAL | 0 refills | Status: AC | PRN

## 2017-07-19 MED ORDER — FERROUS SULFATE 325 (65 FE) MG PO TBEC: 325.0000 mg | DELAYED_RELEASE_TABLET | Freq: Two times a day (BID) | ORAL | 3 refills | Status: DC

## 2017-07-19 MED ORDER — PROPOFOL 500 MG/50ML IV EMUL
INTRAVENOUS | Status: DC | PRN
  Administered 2017-07-19: 150 ug/kg/min via INTRAVENOUS

## 2017-07-19 MED ORDER — GLYCOPYRROLATE 0.2 MG/ML IV SOSY
PREFILLED_SYRINGE | INTRAVENOUS | Status: DC | PRN
  Administered 2017-07-19 (×2): .2 mg via INTRAVENOUS

## 2017-07-19 MED ORDER — PANTOPRAZOLE SODIUM 40 MG PO TBEC
40.0000 mg | DELAYED_RELEASE_TABLET | Freq: Two times a day (BID) | ORAL | Status: DC
  Administered 2017-07-19: 40 mg via ORAL
  Filled 2017-07-19: qty 2

## 2017-07-19 MED ORDER — PANTOPRAZOLE SODIUM 40 MG PO TBEC: 40.0000 mg | DELAYED_RELEASE_TABLET | Freq: Two times a day (BID) | ORAL | 0 refills | Status: DC

## 2017-07-19 MED ORDER — HYDROCORTISONE 2.5 % RE CREA: TOPICAL_CREAM | RECTAL | 0 refills | Status: DC

## 2017-07-19 MED ORDER — NICOTINE 14 MG/24HR TD PT24: 14.0000 mg | MEDICATED_PATCH | Freq: Every day | TRANSDERMAL | 0 refills | Status: DC

## 2017-07-19 NOTE — Op Note (Signed)
[facility] Patient Name: Shane Hicks Procedure Date : 07/19/2017 MRN: [id number] Attending MD: Willaim Rayas. Grayton Lobo MD, MD Date of Birth: [birth year] CSN: [id number] Age: 42 Admit Type: Inpatient Procedure:                Colonoscopy Indications:              Hematochezia, Iron deficiency anemia Providers:                Viviann Spare P. Perris Tripathi MD, MD, Dwain Sarna, RN,                            Kandice Robinsons, Technician Referring MD:              Medicines:                Monitored Anesthesia Care Complications:            No immediate complications. Estimated blood loss:                            None. Estimated Blood Loss:     Estimated blood loss: none. Procedure:                Pre-Anesthesia Assessment:                           - Prior to the procedure, a History and Physical                            was performed, and patient medications and                            allergies were reviewed. The patient's tolerance of                            previous anesthesia was also reviewed. The risks                            and benefits of the procedure and the sedation                            options and risks were discussed with the patient.                            All questions were answered, and informed consent                            was obtained. Prior Anticoagulants: The patient has                            taken no previous anticoagulant or antiplatelet                            agents. ASA Grade Assessment: III - A patient with  severe systemic disease. After reviewing the risks                            and benefits, the patient was deemed in                            satisfactory condition to undergo the procedure.                           After obtaining informed consent, the colonoscope                            was passed under direct vision. Throughout the                            procedure, the  patient's blood pressure, pulse, and                            oxygen saturations were monitored continuously. The                            EC-[id number] ([id number] scope was introduced through                            the anus and advanced to the the terminal ileum,                            with identification of the appendiceal orifice and                            IC valve. The colonoscopy was performed without                            difficulty. The patient tolerated the procedure                            well. The quality of the bowel preparation was                            adequate. The terminal ileum, ileocecal valve,                            appendiceal orifice, and rectum were photographed. Scope In: 12:03:41 PM Scope Out: 12:17:45 PM Scope Withdrawal Time: 0 hours 10 minutes 14 seconds  Total Procedure Duration: 0 hours 14 minutes 4 seconds  Findings:      The perianal and digital rectal examinations were normal.      The terminal ileum appeared normal.      Many medium-mouthed diverticula were found in the entire colon.      Internal hemorrhoids were found during retroflexion, they were large and       inflamed.      The exam was otherwise without abnormality. Impression:               - The examined portion of the ileum  was normal.                           - Diverticulosis in the entire examined colon.                           - Internal hemorrhoids.                           - The examination was otherwise normal.                           - No specimens collected.                           It's possible chronic rectal bleeding from                            hemorrhoids in addition to findings on EGD are                            responsible for anemia. Recommendation:           - Return patient to hospital ward for ongoing care.                           - Advance diet as tolerated.                           - Continue present medications.                            - Start Anusol PR q day                           - Recommend hemorrhoid banding as outpatient, we                            can coordinate follow up in our office                           - IV iron to treat anemia                           - NO NSAIDs (stop Goody powder)                           - Protonix 40mg  BID                           - I think okay to discharge home with close                            outpatient follow up of labs, after IV iron. No                            active bleeding appreciated Procedure  Code(s):        --- Professional ---                           [phone], Colonoscopy, flexible; diagnostic, including                            collection of specimen(s) by brushing or washing,                            when performed (separate procedure) Diagnosis Code(s):        --- Professional ---                           K64.8, Other hemorrhoids                           K92.1, Melena (includes Hematochezia)                           D50.9, Iron deficiency anemia, unspecified                           K57.30, Diverticulosis of large intestine without                            perforation or abscess without bleeding CPT copyright 2017 American Medical Association. All rights reserved. The codes documented in this report are preliminary and upon coder review may  be revised to meet current compliance requirements. Viviann Spare P. Sherronda Sweigert MD, MD 07/19/2017 12:26:18 PM This report has been signed electronically. Number of Addenda: 0

## 2017-07-19 NOTE — Anesthesia Procedure Notes (Signed)
Procedure Name: MAC Performed by: Coralynn Gaona B, CRNA Pre-anesthesia Checklist: Patient identified, Emergency Drugs available, Suction available, Patient being monitored and Timeout performed Patient Re-evaluated:Patient Re-evaluated prior to induction Oxygen Delivery Method: Nasal cannula Placement Confirmation: positive ETCO2 Dental Injury: Teeth and Oropharynx as per pre-operative assessment        

## 2017-07-19 NOTE — Anesthesia Postprocedure Evaluation (Signed)
Anesthesia Post Note  Patient: Shane Hicks  Procedure(s) Performed: ESOPHAGOGASTRODUODENOSCOPY (EGD) (N/A ) COLONOSCOPY (N/A )     Patient location during evaluation: Endoscopy Anesthesia Type: MAC Level of consciousness: awake and alert Pain management: pain level controlled Vital Signs Assessment: post-procedure vital signs reviewed and stable Respiratory status: spontaneous breathing, nonlabored ventilation, respiratory function stable and patient connected to nasal cannula oxygen Cardiovascular status: stable and blood pressure returned to baseline Postop Assessment: no apparent nausea or vomiting Anesthetic complications: no    Last Vitals:  Vitals:   07/19/17 1231 07/19/17 1240  BP: 120/76 139/63  Pulse: 75 61  Resp: 15 16  Temp: 36.4 C   SpO2: 100% 100%    Last Pain:  Vitals:   07/19/17 1240  TempSrc:   PainSc: 0-No pain                 Shane Hicks

## 2017-07-19 NOTE — Progress Notes (Signed)
Patient given discharge instructions and all questions answered.  

## 2017-07-19 NOTE — Plan of Care (Signed)
  Problem: Clinical Measurements: Goal: Will remain free from infection Outcome: Completed/Met Goal: Respiratory complications will improve Outcome: Completed/Met   Problem: Nutrition: Goal: Adequate nutrition will be maintained Outcome: Completed/Met   Problem: Coping: Goal: Level of anxiety will decrease Outcome: Completed/Met   Problem: Pain Managment: Goal: General experience of comfort will improve Outcome: Completed/Met   Problem: Skin Integrity: Goal: Risk for impaired skin integrity will decrease Outcome: Completed/Met

## 2017-07-19 NOTE — Discharge Summary (Signed)
Discharge Summary  Shane Hicks ZOX:[id number] DOB: 01/16/1976  PCP: Patient, No Pcp Per  Admit date: 07/18/2017 Discharge date: 07/19/2017  Time spent: 30  Recommendations for Outpatient Follow-up:  1. Follow-up PCP 2. Follow-up GI Dr. Adela LankArmbruster.  He recommended possible hemorrhoid banding as outpatient.  Discharge Diagnoses:  Active Hospital Problems   Diagnosis Date Noted  . GI bleed 07/18/2017  . Acute lower GI bleeding 07/19/2017  . Tobacco use 07/18/2017  . ETOH abuse 07/18/2017  . Symptomatic anemia 07/18/2017  . Pre-syncope     Resolved Hospital Problems  No resolved problems to display.    Discharge Condition: Improved  Diet recommendation: Regular diet and avoid NSAIDs  Vitals:   07/19/17 1231 07/19/17 1240  BP: 120/76 139/63  Pulse: 75 61  Resp: 15 16  Temp: 97.6 F (36.4 C)   SpO2: 100% 100%    History of present illness:   Shane FlatteryDamone Toman is a 42 y.o. male with medical history significant  For EtOH use tobacco use chronic headaches for which he uses goody powders presents emergency Department chief complaint dizziness and near syncope. Initial evaluation reveals hemoglobin of 7.2 and heme positive stool.     Hospital Course:  Principal Problem:   GI bleed Active Problems:   Pre-syncope   Symptomatic anemia   Tobacco use   ETOH abuse   Acute lower GI bleeding Patient was admitted to telemetry he had one more GI bleed this morning when he woke up but he was mostly watery stool and a state tinge of blood it was not frank blood or like it was when he  was at home.  He denied any abdominal pain no fainting no dizziness.  Patient underwent EGD and colonoscopy they found internal hemorrhoids and diverticulosis.  His upper GI shows duodenitis  and small hiatal hernia  Procedures:  Colonoscopy and EGD, July 19, 2017  Iron transfusion  Consultations:  GI Dr. Adela LankARMBRUSTER  Discharge Exam: BP 139/63   Pulse 61   Temp 97.6 F (36.4 C) (Oral)   Resp  16   Ht 6' (1.829 m)   Wt 70.6 kg (155 lb 9.6 oz)   SpO2 100%   BMI 21.10 kg/m     General:  Appears calm and comfortable  Eyes:  PERRL, EOMI, normal lids, iris  ENT:  grossly normal hearing, lips & tongue, mucous membranes of his mouth are moist and pink  Neck:  no LAD, masses or thyromegaly  Cardiovascular:  RRR, no m/r/g. No LE edema.   Respiratory:  CTA bilaterally, no w/r/r. Normal respiratory effort.  Abdomen:  soft, ntnd, ositive bowel sounds throughout no guarding or rebounding  Skin:  no rash or induration seen on limited exam   Musculoskeletal:  grossly normal tone BUE/BLE, good ROM, no bony abnormality  Psychiatric:  grossly normal mood and affect, speech fluent and appropriate, AOx3  Neurologic:  CN 2-12 grossly intact, moves all extremities in coordinated fashion, sensation intact   Discharge Instructions You were cared for by a hospitalist during your hospital stay. If you have any questions about your discharge medications or the care you received while you were in the hospital after you are discharged, you can call the unit and asked to speak with the hospitalist on call if the hospitalist that took care of you is not available. Once you are discharged, your primary care physician will handle any further medical issues. Please note that NO REFILLS for any discharge medications will be authorized once you are discharged,  as it is imperative that you return to your primary care physician (or establish a relationship with a primary care physician if you do not have one) for your aftercare needs so that they can reassess your need for medications and monitor your lab values.  Discharge Instructions    Diet - low sodium heart healthy   Complete by:  As directed    Diet - low sodium heart healthy   Complete by:  As directed    Increase activity slowly   Complete by:  As directed    Increase activity slowly   Complete by:  As directed      Allergies as of  07/19/2017   No Known Allergies     Medication List    STOP taking these medications   BC HEADACHE POWDER PO     TAKE these medications   acetaminophen 325 MG tablet Commonly known as:  TYLENOL Take 2 tablets (650 mg total) by mouth every 6 (six) hours as needed for mild pain (or Fever >/= 101).   ferrous sulfate 325 (65 FE) MG EC tablet Take 1 tablet (325 mg total) by mouth 2 (two) times daily.   folic acid 1 MG tablet Commonly known as:  FOLVITE Take 1 tablet (1 mg total) by mouth daily. Start taking on:  07/20/2017   hydrocortisone 2.5 % rectal cream Commonly known as:  ANUSOL-HC Apply rectally 2 times daily   nicotine 14 mg/24hr patch Commonly known as:  NICODERM CQ - dosed in mg/24 hours Place 1 patch (14 mg total) onto the skin daily. Start taking on:  07/20/2017   pantoprazole 40 MG tablet Commonly known as:  PROTONIX Take 1 tablet (40 mg total) by mouth 2 (two) times daily.   pantoprazole 40 MG tablet Commonly known as:  PROTONIX Take 1 tablet (40 mg total) by mouth 2 (two) times daily.      No Known Allergies Follow-up Information    pcp. Schedule an appointment as soon as possible for a visit in 1 week(s).        Armbruster, Willaim Rayas, MD. Schedule an appointment as soon as possible for a visit in 1 week(s).   Specialty:  Gastroenterology Why:  office will call with f/u appt Contact information: [address] [place] [address] [phone]            The results of significant diagnostics from this hospitalization (including imaging, microbiology, ancillary and laboratory) are listed below for reference.    Significant Diagnostic Studies: No results found.  Microbiology: No results found for this or any previous visit (from the past 240 hour(s)).   Labs: Basic Metabolic Panel: Recent Labs  Lab 07/18/17 1018 07/19/17 0721  NA 140 141  K 3.6 4.3  CL 105 109  CO2 19* 20*  GLUCOSE 111* 84  BUN 6 7  CREATININE 0.87 0.94    CALCIUM 8.7* 9.2   Liver Function Tests: Recent Labs  Lab 07/19/17 0721  AST 33  ALT 28  ALKPHOS 44  BILITOT 1.2  PROT 7.6  ALBUMIN 3.8   No results for input(s): LIPASE, AMYLASE in the last 168 hours. No results for input(s): AMMONIA in the last 168 hours. CBC: Recent Labs  Lab 07/18/17 1018 07/18/17 2053 07/19/17 0721  WBC 5.5  --  4.7  HGB 7.3* 8.0* 8.2*  HCT 25.2* 26.3* 27.0*  MCV 62.4*  --  64.4*  PLT 252  --  227   Cardiac Enzymes: No  results for input(s): CKTOTAL, CKMB, CKMBINDEX, TROPONINI in the last 168 hours. BNP: BNP (last 3 results) No results for input(s): BNP in the last 8760 hours.  ProBNP (last 3 results) No results for input(s): PROBNP in the last 8760 hours.  CBG: No results for input(s): GLUCAP in the last 168 hours.     Signed:  Myrtie Neither, MD [facility] 07/19/2017, 6:34 PM

## 2017-07-19 NOTE — Interval H&P Note (Signed)
History and Physical Interval Note:  07/19/2017 11:26 AM  Shane Hicks  has presented today for surgery, with the diagnosis of iron deficiency anemia, heme positive stools  The various methods of treatment have been discussed with the patient and family. After consideration of risks, benefits and other options for treatment, the patient has consented to  Procedure(s): ESOPHAGOGASTRODUODENOSCOPY (EGD) (N/A) COLONOSCOPY (N/A) as a surgical intervention .  The patient's history has been reviewed, patient examined, no change in status, stable for surgery.  I have reviewed the patient's chart and labs.  Questions were answered to the patient's satisfaction.     Viviann SpareSteven P Meshach Perry

## 2017-07-19 NOTE — Transfer of Care (Signed)
Immediate Anesthesia Transfer of Care Note  Patient: Shane Hicks  Procedure(s) Performed: ESOPHAGOGASTRODUODENOSCOPY (EGD) (N/A ) COLONOSCOPY (N/A )  Patient Location: Endoscopy Unit  Anesthesia Type:MAC  Level of Consciousness: awake and alert   Airway & Oxygen Therapy: Patient Spontanous Breathing  Post-op Assessment: Report given to RN and Post -op Vital signs reviewed and stable  Post vital signs: Reviewed and stable  Last Vitals:  Vitals Value Taken Time  BP 120/76 07/19/2017 12:29 PM  Temp    Pulse 77 07/19/2017 12:29 PM  Resp 20 07/19/2017 12:29 PM  SpO2 100 % 07/19/2017 12:29 PM  Vitals shown include unvalidated device data.  Last Pain:  Vitals:   07/19/17 1119  TempSrc:   PainSc: 0-No pain         Complications: No apparent anesthesia complications

## 2017-07-19 NOTE — Anesthesia Preprocedure Evaluation (Signed)
Anesthesia Evaluation  Patient identified by MRN, date of birth, ID band Patient awake    Reviewed: Allergy & Precautions, NPO status , Patient's Chart, lab work & pertinent test results  Airway Mallampati: II  TM Distance: >3 FB Neck ROM: Full    Dental  (+) Teeth Intact, Dental Advisory Given   Pulmonary Current Smoker,    breath sounds clear to auscultation       Cardiovascular  Rhythm:Regular Rate:Normal     Neuro/Psych    GI/Hepatic   Endo/Other    Renal/GU      Musculoskeletal   Abdominal   Peds  Hematology   Anesthesia Other Findings   Reproductive/Obstetrics                             Anesthesia Physical Anesthesia Plan  ASA: II  Anesthesia Plan: MAC   Post-op Pain Management:    Induction: Intravenous  PONV Risk Score and Plan: Propofol infusion  Airway Management Planned: Natural Airway and Nasal Cannula  Additional Equipment:   Intra-op Plan:   Post-operative Plan:   Informed Consent: I have reviewed the patients History and Physical, chart, labs and discussed the procedure including the risks, benefits and alternatives for the proposed anesthesia with the patient or authorized representative who has indicated his/her understanding and acceptance.   Dental advisory given  Plan Discussed with: CRNA and Anesthesiologist  Anesthesia Plan Comments:         Anesthesia Quick Evaluation

## 2017-07-19 NOTE — Op Note (Signed)
[facility] Patient Name: Shane Hicks Procedure Date : 07/19/2017 MRN: [id number] Attending MD: Carlota Raspberry. Laren Whaling MD, MD Date of Birth: 1975-05-28 CSN: [id number] Age: 42 Admit Type: Inpatient Procedure:                Upper GI endoscopy Indications:              Iron deficiency anemia, history of NSAID use Providers:                Carlota Raspberry. Bakari Nikolai MD, MD, Cleda Daub, RN,                            William Dalton, Technician Referring MD:              Medicines:                Monitored Anesthesia Care Complications:            No immediate complications. Estimated blood loss:                            Minimal. Estimated Blood Loss:     Estimated blood loss was minimal. Procedure:                Pre-Anesthesia Assessment:                           - Prior to the procedure, a History and Physical                            was performed, and patient medications and                            allergies were reviewed. The patient's tolerance of                            previous anesthesia was also reviewed. The risks                            and benefits of the procedure and the sedation                            options and risks were discussed with the patient.                            All questions were answered, and informed consent                            was obtained. Prior Anticoagulants: The patient has                            taken no previous anticoagulant or antiplatelet                            agents. ASA Grade Assessment: III - A patient with  severe systemic disease. After reviewing the risks                            and benefits, the patient was deemed in                            satisfactory condition to undergo the procedure.                           After obtaining informed consent, the endoscope was                            passed under direct vision. Throughout the                             procedure, the patient's blood pressure, pulse, and                            oxygen saturations were monitored continuously. The                            EG-[id number] ([id number]) scope was introduced through the                            mouth, and advanced to the second part of duodenum.                            The upper GI endoscopy was accomplished without                            difficulty. The patient tolerated the procedure                            well. Scope In: Scope Out: Findings:      Esophagogastric landmarks were identified: the Z-line was found at 41 cm       and the gastroesophageal junction was found at 41 cm from the incisors.      A small hiatal hernia was present.      The exam of the esophagus was otherwise normal.      One clean based non-bleeding superficial gastric ulcer, roughly 20m in       size or so, with no stigmata of bleeding was found in the gastric antrum.      The exam of the stomach was otherwise normal.      Biopsies were taken with a cold forceps in the gastric body, at the       incisura and in the gastric antrum for Helicobacter pylori testing.      A few erosions were found in the duodenal bulb with duodenitis.      The exam of the duodenum was otherwise normal. Impression:               - Esophagogastric landmarks identified.                           - Small hiatal hernia.                           -  Non-bleeding gastric ulcer with no stigmata of                            bleeding.                           - Duodenal erosions / duodenitis.                           - Biopsies were taken with a cold forceps for                            Helicobacter pylori testing.                           Anemia could be related to gastric ulcer /                            duodenitis in the setting of chronic NSAID use in                            addition to chronic rectal bleeding from inflamed                            hemorrhoids. Moderate  Sedation:      No moderate sedation, case performed with MAC Recommendation:           - Return patient to hospital ward for ongoing care.                           - Advance diet as tolerated.                           - Continue present medications.                           - Start protonix 47m PO BID                           - NO NSAIDs, stop Goody powder                           - Await pathology results.                           - dose of IV iron                           - Anusol to treat hemorrhoids, recommend hemorrhoid                            banding as outpatient                           - I think okay to discharge home later today with  close outpatient follow up, he's not having any                            active bleeding. We will relay path results and                            coordinate follow up. Procedure Code(s):        --- Professional ---                           [phone], Esophagogastroduodenoscopy, flexible,                            transoral; with biopsy, single or multiple Diagnosis Code(s):        --- Professional ---                           K44.9, Diaphragmatic hernia without obstruction or                            gangrene                           K25.9, Gastric ulcer, unspecified as acute or                            chronic, without hemorrhage or perforation                           K26.9, Duodenal ulcer, unspecified as acute or                            chronic, without hemorrhage or perforation                           D50.9, Iron deficiency anemia, unspecified CPT copyright 2017 American Medical Association. All rights reserved. The codes documented in this report are preliminary and upon coder review may  be revised to meet current compliance requirements. Remo Lipps P. Cathy Ropp MD, MD 07/19/2017 12:32:53 PM This report has been signed electronically. Number of Addenda: 0

## 2017-07-19 NOTE — Progress Notes (Signed)
Nurse arrived to assess pt, pt reports last bm was liquidy with some blood to it, Dr Barrie FolkUgah was also at bedside and aware, pt did have grape juice this am, pt report was NPO but pt said he woke up and had a liquid tray so he drank it, called GI and [facility] who clarified with anesthesia that they will still do EGD colonscopy

## 2017-07-22 ENCOUNTER — Other Ambulatory Visit: Payer: Self-pay

## 2017-07-22 DIAGNOSIS — D509 Iron deficiency anemia, unspecified: Secondary | ICD-10-CM

## 2017-07-22 MED ORDER — CLARITHROMYCIN 500 MG PO TABS: 500.0000 mg | ORAL_TABLET | Freq: Two times a day (BID) | ORAL | 0 refills | Status: DC

## 2017-07-22 MED ORDER — AMOXICILLIN 500 MG PO TABS: 1000.0000 mg | ORAL_TABLET | Freq: Two times a day (BID) | ORAL | 0 refills | Status: AC

## 2017-07-22 MED ORDER — METRONIDAZOLE 500 MG PO TABS: 500.0000 mg | ORAL_TABLET | Freq: Two times a day (BID) | ORAL | 0 refills | Status: DC

## 2017-09-17 ENCOUNTER — Encounter: Payer: PRIVATE HEALTH INSURANCE | Admitting: Gastroenterology

## 2017-09-27 ENCOUNTER — Other Ambulatory Visit: Payer: Self-pay

## 2017-09-27 ENCOUNTER — Encounter (HOSPITAL_COMMUNITY): Payer: Self-pay | Admitting: Emergency Medicine

## 2017-09-27 ENCOUNTER — Emergency Department (HOSPITAL_COMMUNITY)
Admission: EM | Admit: 2017-09-27 | Discharge: 2017-09-27 | Disposition: A | Discharge status: Home / Self Care | Payer: PRIVATE HEALTH INSURANCE | Attending: Emergency Medicine | Admitting: Emergency Medicine

## 2017-09-27 DIAGNOSIS — K921 Melena: Secondary | ICD-10-CM | POA: Diagnosis present

## 2017-09-27 DIAGNOSIS — F17210 Nicotine dependence, cigarettes, uncomplicated: Secondary | ICD-10-CM | POA: Diagnosis not present

## 2017-09-27 DIAGNOSIS — Z79899 Other long term (current) drug therapy: Secondary | ICD-10-CM | POA: Insufficient documentation

## 2017-09-27 LAB — COMPREHENSIVE METABOLIC PANEL
ALT: 19 U/L (ref 0–44)
ANION GAP: 10 (ref 5–15)
AST: 22 U/L (ref 15–41)
Albumin: 3.9 g/dL (ref 3.5–5.0)
Alkaline Phosphatase: 56 U/L (ref 38–126)
BUN: 7 mg/dL (ref 6–20)
CO2: 22 mmol/L (ref 22–32)
CREATININE: 1 mg/dL (ref 0.61–1.24)
Calcium: 9.2 mg/dL (ref 8.9–10.3)
Chloride: 108 mmol/L (ref 98–111)
Glucose, Bld: 94 mg/dL (ref 70–99)
Potassium: 4 mmol/L (ref 3.5–5.1)
SODIUM: 140 mmol/L (ref 135–145)
Total Bilirubin: 0.4 mg/dL (ref 0.3–1.2)
Total Protein: 6.9 g/dL (ref 6.5–8.1)

## 2017-09-27 LAB — CBC
HCT: 46.3 % (ref 39.0–52.0)
HEMOGLOBIN: 15.2 g/dL (ref 13.0–17.0)
MCH: 28.4 pg (ref 26.0–34.0)
MCHC: 32.8 g/dL (ref 30.0–36.0)
MCV: 86.4 fL (ref 78.0–100.0)
PLATELETS: 200 10*3/uL (ref 150–400)
RBC: 5.36 MIL/uL (ref 4.22–5.81)
RDW: 18.6 % — ABNORMAL HIGH (ref 11.5–15.5)
WBC: 6.8 10*3/uL (ref 4.0–10.5)

## 2017-09-27 LAB — POC OCCULT BLOOD, ED: Fecal Occult Bld: POSITIVE — AB

## 2017-09-27 LAB — TYPE AND SCREEN
ABO/RH(D): A POS
ANTIBODY SCREEN: NEGATIVE

## 2017-09-27 MED ORDER — ACETAMINOPHEN 325 MG PO TABS
650.0000 mg | ORAL_TABLET | Freq: Once | ORAL | Status: AC
  Administered 2017-09-27: 650 mg via ORAL

## 2017-09-27 NOTE — ED Triage Notes (Signed)
Pt states he continues to have rectal bleeding and now feels nauseated.  Reports bring nauseated X1 day usually when bleeding starts.

## 2017-09-27 NOTE — ED Provider Notes (Signed)
[facility] EMERGENCY DEPARTMENT Provider Note   CSN: [id number] Arrival date & time: 09/27/17  2440     History   Chief Complaint Chief Complaint  Patient presents with  . Blood In Stools    HPI Shane Hicks is a 42 y.o. male.  HPI Pt has a history of GI bleeds.   He was admitted to the hospital in April of this year.  Pt had endoscopy and colonoscopy.  Pt was noted to have hemorrhoids however he required transfusions.  Pt missed his last follow up because of work conflicts.  He noticed blood in his stool again.  This has continued since leaving the hospital.  It occurs off and on.  This past week he has noticed it happen more frequently again.  Every few days.  This am it occurred again and he felt nauseated this am so he came to get checked out. Past Medical History:  Diagnosis Date  . Pre-syncope     Patient Active Problem List   Diagnosis Date Noted  . Acute lower GI bleeding 07/19/2017  . Symptomatic anemia 07/18/2017  . GI bleed 07/18/2017  . Tobacco use 07/18/2017  . ETOH abuse 07/18/2017  . Pre-syncope   . NSAID long-term use     Past Surgical History:  Procedure Laterality Date  . COLONOSCOPY N/A 07/19/2017   Procedure: COLONOSCOPY;  Surgeon: Benancio Deeds, MD;  [facility] ENDOSCOPY;  Service: Gastroenterology;  Laterality: N/A;  . ESOPHAGOGASTRODUODENOSCOPY N/A 07/19/2017   Procedure: ESOPHAGOGASTRODUODENOSCOPY (EGD);  Surgeon: Benancio Deeds, MD;  Location: [facility] ENDOSCOPY;  Service: Gastroenterology;  Laterality: N/A;        Home Medications    Prior to Admission medications   Medication Sig Start Date End Date Taking? Authorizing Provider  acetaminophen (TYLENOL) 325 MG tablet Take 2 tablets (650 mg total) by mouth every 6 (six) hours as needed for mild pain (or Fever >/= 101). 07/19/17   Myrtie Neither, MD  clarithromycin (BIAXIN) 500 MG tablet Take 1 tablet (500 mg total) by mouth 2 (two) times daily. 07/22/17    Armbruster, Willaim Rayas, MD  ferrous sulfate 325 (65 FE) MG EC tablet Take 1 tablet (325 mg total) by mouth 2 (two) times daily. 07/19/17 07/19/18  Myrtie Neither, MD  folic acid (FOLVITE) 1 MG tablet Take 1 tablet (1 mg total) by mouth daily. 07/20/17   Myrtie Neither, MD  hydrocortisone (ANUSOL-HC) 2.5 % rectal cream Apply rectally 2 times daily 07/19/17 07/19/18  Myrtie Neither, MD  metroNIDAZOLE (FLAGYL) 500 MG tablet Take 1 tablet (500 mg total) by mouth 2 (two) times daily. 07/22/17   Armbruster, Willaim Rayas, MD  nicotine (NICODERM CQ - DOSED IN MG/24 HOURS) 14 mg/24hr patch Place 1 patch (14 mg total) onto the skin daily. 07/20/17   Myrtie Neither, MD  pantoprazole (PROTONIX) 40 MG tablet Take 1 tablet (40 mg total) by mouth 2 (two) times daily. 07/19/17   Myrtie Neither, MD  pantoprazole (PROTONIX) 40 MG tablet Take 1 tablet (40 mg total) by mouth 2 (two) times daily. 07/19/17 07/19/18  Myrtie Neither, MD    Family History Family History  Problem Relation Age of Onset  . Hypertension Mother     Social History Social History   Tobacco Use  . Smoking status: Current Every Day Smoker    Packs/day: 1.00    Types: Cigarettes  . Smokeless tobacco: Never Used  Substance Use Topics  . Alcohol use: Yes    Comment: pt reports last  use last night   . Drug use: Not Currently     Allergies   Patient has no known allergies.   Review of Systems Review of Systems  All other systems reviewed and are negative.    Physical Exam Updated Vital Signs BP (!) 163/96 (BP Location: Right Arm)   Pulse 65   Temp 98.1 F (36.7 C) (Oral)   Resp 16   Ht 1.829 m (6')   Wt 72.6 kg (160 lb)   SpO2 99%   BMI 21.70 kg/m   Physical Exam  Constitutional: He appears well-developed and well-nourished. No distress.  HENT:  Head: Normocephalic and atraumatic.  Right Ear: External ear normal.  Left Ear: External ear normal.  Eyes: Conjunctivae are normal. Right eye exhibits no discharge. Left  eye exhibits no discharge. No scleral icterus.  Neck: Neck supple. No tracheal deviation present.  Cardiovascular: Normal rate, regular rhythm and intact distal pulses.  Pulmonary/Chest: Effort normal and breath sounds normal. No stridor. No respiratory distress. He has no wheezes. He has no rales.  Abdominal: Soft. Bowel sounds are normal. He exhibits no distension. There is no tenderness. There is no rebound and no guarding.  Genitourinary:  Genitourinary Comments: No mass noted, no impaction, blood tinged stool  Musculoskeletal: He exhibits no edema or tenderness.  Neurological: He is alert. He has normal strength. No cranial nerve deficit (no facial droop, extraocular movements intact, no slurred speech) or sensory deficit. He exhibits normal muscle tone. He displays no seizure activity. Coordination normal.  Skin: Skin is warm and dry. No rash noted.  Psychiatric: He has a normal mood and affect.  Nursing note and vitals reviewed.    ED Treatments / Results  Labs (all labs ordered are listed, but only abnormal results are displayed) Labs Reviewed  CBC - Abnormal; Notable for the following components:      Result Value   RDW 18.6 (*)    All other components within normal limits  POC OCCULT BLOOD, ED - Abnormal; Notable for the following components:   Fecal Occult Bld POSITIVE (*)    All other components within normal limits  COMPREHENSIVE METABOLIC PANEL  TYPE AND SCREEN     Procedures Procedures (including critical care time)  Medications Ordered in ED Medications  acetaminophen (TYLENOL) tablet 650 mg (650 mg Oral Given 09/27/17 0827)     Initial Impression / Assessment and Plan / ED Course  I have reviewed the triage vital signs and the nursing notes.  Pertinent labs & imaging results that were available during my care of the patient were reviewed by me and considered in my medical decision making (see chart for details).   Patient presented to the emergency room  for evaluation of rectal bleeding.  Patient was previously in the hospital back in April for rectal bleeding and anemia.  He had a colonoscopy and an endoscopy.  Patient was noted to have some findings on his endoscopy but no evidence of any active bleeding.  He also had active inflamed hemorrhoids on the colonoscopy.  Patient was given an iron infusion.  He is instructed to follow-up with GI for possible hemorrhoid banding.  Patient did not make that last appointment.  His ED work-up is reassuring.  He is not anemic here.  He does have some blood in his stool but I suspect this may be related to the hemorrhoids.  Patient's exam is otherwise reassuring.  I discussed the findings with him and recommended he contact the GI doctor  to discuss further treatment as previously recommended.  Final Clinical Impressions(s) / ED Diagnoses   Final diagnoses:  Blood in stool    ED Discharge Orders    None       Linwood Dibbles, MD 09/27/17 [phone]

## 2017-09-27 NOTE — Discharge Instructions (Addendum)
Follow-up with the GI doctor as previously recommended, try taking an over-the-counter stool softener such as Colace to help prevent any hemorrhoid irritation, return as needed for worsening symptoms

## 2017-10-16 ENCOUNTER — Other Ambulatory Visit (INDEPENDENT_AMBULATORY_CARE_PROVIDER_SITE_OTHER): Payer: PRIVATE HEALTH INSURANCE

## 2017-10-16 ENCOUNTER — Encounter: Payer: Self-pay | Admitting: Gastroenterology

## 2017-10-16 ENCOUNTER — Ambulatory Visit: Payer: PRIVATE HEALTH INSURANCE | Admitting: Gastroenterology

## 2017-10-16 VITALS — BP 124/76 | HR 79 | Ht 72.0 in | Wt 158.0 lb

## 2017-10-16 DIAGNOSIS — K640 First degree hemorrhoids: Secondary | ICD-10-CM | POA: Diagnosis not present

## 2017-10-16 DIAGNOSIS — Z8619 Personal history of other infectious and parasitic diseases: Secondary | ICD-10-CM | POA: Diagnosis not present

## 2017-10-16 DIAGNOSIS — K648 Other hemorrhoids: Secondary | ICD-10-CM

## 2017-10-16 LAB — H. PYLORI ANTIBODY, IGG: H Pylori IgG: POSITIVE — AB

## 2017-10-16 NOTE — Progress Notes (Signed)
Patient here for a follow up visit. He initially presented to the hospital with severe anemia, thought to be multifactorial due to H pylori / peptic ulcer and bleeding internal hemorrhoids. He has been treated for H pylori and tolerated the medications. Anemia resolved. He continues to have intermittent bleeding from his hemorrhoids which led him to a recent ER visit. We discussed options for therapy, he wanted to proceed with banding. Otherwise, recommend H pylori stool antigen to test for H pylori eradication post therapy, he agreed.  PROCEDURE NOTE: The patient presents with symptomatic grade I  hemorrhoids, requesting rubber band ligation of his/her hemorrhoidal disease.  All risks, benefits and alternative forms of therapy were described and informed consent was obtained.  In the Left Lateral Decubitus position anoscopic examination revealed grade I hemorrhoids in all positions.  The anorectum was pre-medicated with 0.125% nitroglycerin The decision was made to band the LL internal hemorrhoid, and the [facility] was used to perform band ligation without complication.  Digital anorectal examination was then performed to assure proper positioning of the band, and to adjust the banded tissue as required.  The patient was discharged home without pain or other issues.  Dietary and behavioral recommendations were given and along with follow-up instructions.     The following adjunctive treatments were recommended: Daily fiber supplement  The patient will return in 2-4 weeks for  follow-up and possible additional banding as required. No complications were encountered and the patient tolerated the procedure well.  Ileene PatrickSteven Elener Custodio, MD [facility]

## 2017-10-16 NOTE — Patient Instructions (Addendum)
If you are age 43 or older, your body mass index should be between 23-30. Your Body mass index is 21.43 kg/m. If this is out of the aforementioned range listed, please consider follow up with your Primary Care Provider.  If you are age 43 or younger, your body mass index should be between 19-25. Your Body mass index is 21.43 kg/m. If this is out of the aformentioned range listed, please consider follow up with your Primary Care Provider.   HEMORRHOID BANDING PROCEDURE    FOLLOW-UP CARE   1. The procedure you have had should have been relatively painless since the banding of the area involved does not have nerve endings and there is no pain sensation.  The rubber band cuts off the blood supply to the hemorrhoid and the band may fall off as soon as 48 hours after the banding (the band may occasionally be seen in the toilet bowl following a bowel movement). You may notice a temporary feeling of fullness in the rectum which should respond adequately to plain Tylenol or Motrin.  2. Following the banding, avoid strenuous exercise that evening and resume full activity the next day.  A sitz bath (soaking in a warm tub) or bidet is soothing, and can be useful for cleansing the area after bowel movements.     3. To avoid constipation, take two tablespoons of natural wheat bran, natural oat bran, flax, Benefiber or any over the counter fiber supplement and increase your water intake to 7-8 glasses daily.    4. Unless you have been prescribed anorectal medication, do not put anything inside your rectum for two weeks: No suppositories, enemas, fingers, etc.  5. Occasionally, you may have more bleeding than usual after the banding procedure.  This is often from the untreated hemorrhoids rather than the treated one.  Don't be concerned if there is a tablespoon or so of blood.  If there is more blood than this, lie flat with your bottom higher than your head and apply an ice pack to the area. If the bleeding  does not stop within a half an hour or if you feel faint, call our office at [phone] or go to the emergency room.  6. Problems are not common; however, if there is a substantial amount of bleeding, severe pain, chills, fever or difficulty passing urine (very rare) or other problems, you should call us at [phone] or report to the nearest emergency room.  7. Do not stay seated continuously for more than 2-3 hours for a day or two after the procedure.  Tighten your buttock muscles 10-15 times every two hours and take 10-15 deep breaths every 1-2 hours.  Do not spend more than a few minutes on the toilet if you cannot empty your bowel; instead re-visit the toilet at a later time.     Please go to the lab in the basement of our building to have lab work done as you leave today.  Please purchase the following medications over the counter and take as directed: Daily fiber supplement  Thank you for entrusting me with your care and for choosing [facility], Dr. Ileene PatrickSteven Armbruster

## 2017-10-19 ENCOUNTER — Other Ambulatory Visit: Payer: Self-pay

## 2017-10-19 DIAGNOSIS — Z8619 Personal history of other infectious and parasitic diseases: Secondary | ICD-10-CM

## 2017-10-19 NOTE — Progress Notes (Signed)
Wrong lab test ordered per result note.

## 2017-11-05 ENCOUNTER — Telehealth: Payer: Self-pay

## 2017-11-05 ENCOUNTER — Encounter: Payer: PRIVATE HEALTH INSURANCE | Admitting: Gastroenterology

## 2017-11-05 NOTE — Telephone Encounter (Signed)
Called pt and LM to see if he intends to continue with banding appts. He had a 2nd banding ppt today and no showed. He is scheduled for a 3rd appt on 11-23-17 at 4:00pm. Need to clarify if he wants to cancel the appt on 11-23-17 or keep.

## 2017-11-09 NOTE — Telephone Encounter (Signed)
Called pt again. He said he does want to proceed but sometimes work gets too busy and he can't get off.  I let him know that he has an appt on 8-26 at 4:00pm.  He said as far as he knows that would work for him.  I asked him to call us if he cannot make the appt.  Sending patient a letter with his appt dates.

## 2017-11-23 ENCOUNTER — Encounter: Payer: PRIVATE HEALTH INSURANCE | Admitting: Gastroenterology

## 2017-12-07 ENCOUNTER — Emergency Department (HOSPITAL_COMMUNITY)
Admission: EM | Admit: 2017-12-07 | Discharge: 2017-12-07 | Disposition: A | Discharge status: Home / Self Care | Payer: PRIVATE HEALTH INSURANCE | Attending: Emergency Medicine | Admitting: Emergency Medicine

## 2017-12-07 ENCOUNTER — Other Ambulatory Visit: Payer: Self-pay

## 2017-12-07 ENCOUNTER — Emergency Department (HOSPITAL_COMMUNITY): Payer: PRIVATE HEALTH INSURANCE

## 2017-12-07 DIAGNOSIS — R42 Dizziness and giddiness: Secondary | ICD-10-CM | POA: Insufficient documentation

## 2017-12-07 DIAGNOSIS — R0602 Shortness of breath: Secondary | ICD-10-CM | POA: Insufficient documentation

## 2017-12-07 DIAGNOSIS — F17200 Nicotine dependence, unspecified, uncomplicated: Secondary | ICD-10-CM | POA: Diagnosis not present

## 2017-12-07 DIAGNOSIS — R531 Weakness: Secondary | ICD-10-CM | POA: Diagnosis present

## 2017-12-07 LAB — TYPE AND SCREEN
ABO/RH(D): A POS
Antibody Screen: NEGATIVE

## 2017-12-07 LAB — COMPREHENSIVE METABOLIC PANEL
ALBUMIN: 4.1 g/dL (ref 3.5–5.0)
ALK PHOS: 46 U/L (ref 38–126)
ALT: 27 U/L (ref 0–44)
ANION GAP: 9 (ref 5–15)
AST: 41 U/L (ref 15–41)
BUN: 6 mg/dL (ref 6–20)
CALCIUM: 9.5 mg/dL (ref 8.9–10.3)
CHLORIDE: 105 mmol/L (ref 98–111)
CO2: 29 mmol/L (ref 22–32)
Creatinine, Ser: 0.99 mg/dL (ref 0.61–1.24)
GFR calc Af Amer: >60 mL/min (ref >60)
GFR calc non Af Amer: >60 mL/min (ref >60)
GLUCOSE: 123 mg/dL — AB (ref 70–99)
POTASSIUM: 4.1 mmol/L (ref 3.5–5.1)
Sodium: 143 mmol/L (ref 135–145)
Total Bilirubin: 0.3 mg/dL (ref 0.3–1.2)
Total Protein: 7.8 g/dL (ref 6.5–8.1)

## 2017-12-07 LAB — CBC
HEMATOCRIT: 43.5 % (ref 39.0–52.0)
HEMOGLOBIN: 14.5 g/dL (ref 13.0–17.0)
MCH: 31.1 pg (ref 26.0–34.0)
MCHC: 33.3 g/dL (ref 30.0–36.0)
MCV: 93.3 fL (ref 78.0–100.0)
Platelets: 144 10*3/uL — ABNORMAL LOW (ref 150–400)
RBC: 4.66 MIL/uL (ref 4.22–5.81)
RDW: 13.9 % (ref 11.5–15.5)
WBC: 7.9 10*3/uL (ref 4.0–10.5)

## 2017-12-07 LAB — I-STAT TROPONIN, ED: TROPONIN I, POC: 0.02 ng/mL (ref 0.00–0.08)

## 2017-12-07 LAB — D-DIMER, QUANTITATIVE: D-Dimer, Quant: 0.28 ug/mL-FEU (ref 0.00–0.50)

## 2017-12-07 MED ORDER — ALBUTEROL SULFATE HFA 108 (90 BASE) MCG/ACT IN AERS: 1.0000 | INHALATION_SPRAY | Freq: Four times a day (QID) | RESPIRATORY_TRACT | 0 refills | Status: DC | PRN

## 2017-12-07 MED ORDER — SODIUM CHLORIDE 0.9 % IV BOLUS
1000.0000 mL | Freq: Once | INTRAVENOUS | Status: AC
  Administered 2017-12-07: 1000 mL via INTRAVENOUS

## 2017-12-07 MED ORDER — ALBUTEROL SULFATE (2.5 MG/3ML) 0.083% IN NEBU
2.5000 mg | INHALATION_SOLUTION | Freq: Once | RESPIRATORY_TRACT | Status: AC
  Administered 2017-12-07: 2.5 mg via RESPIRATORY_TRACT
  Filled 2017-12-07: qty 3

## 2017-12-07 NOTE — ED Provider Notes (Signed)
Emergency Department Provider Note   I have reviewed the triage vital signs and the nursing notes.   HISTORY  Chief Complaint Dizziness   HPI Shane Hicks is a 42 y.o. male with PMH of EtOH use, Tobacco use, and lower GI bleeding presents to the emergency department for evaluation of generalized weakness, mild dyspnea, and lightheadedness.  The patient states that he felt similarly with his prior episode of anemia and GI bleeding.  He has not noticed significant amounts of blood in his stool.  He does have occasional streaking of bright red blood.  No black stools.  Patient denies any chest pain, productive cough, fever.  Patient states he feels like he cannot take a deep breath.  No abdominal discomfort or back pain.  No fevers or chills. No history of DVT/PE.   Past Medical History:  Diagnosis Date  . Pre-syncope     Patient Active Problem List   Diagnosis Date Noted  . Acute lower GI bleeding 07/19/2017  . Symptomatic anemia 07/18/2017  . GI bleed 07/18/2017  . Tobacco use 07/18/2017  . ETOH abuse 07/18/2017  . Pre-syncope   . NSAID long-term use     Past Surgical History:  Procedure Laterality Date  . COLONOSCOPY N/A 07/19/2017   Procedure: COLONOSCOPY;  Surgeon: Benancio Deeds, MD;  Location: [facility] ENDOSCOPY;  Service: Gastroenterology;  Laterality: N/A;  . ESOPHAGOGASTRODUODENOSCOPY N/A 07/19/2017   Procedure: ESOPHAGOGASTRODUODENOSCOPY (EGD);  Surgeon: Benancio Deeds, MD;  Location: [facility] ENDOSCOPY;  Service: Gastroenterology;  Laterality: N/A;    Allergies Patient has no known allergies.  Family History  Problem Relation Age of Onset  . Hypertension Mother     Social History Social History   Tobacco Use  . Smoking status: Current Every Day Smoker    Packs/day: 1.00    Types: Cigarettes  . Smokeless tobacco: Never Used  Substance Use Topics  . Alcohol use: Yes    Comment: 3 to 5 beers a day  . Drug use: Not Currently    Review of  Systems  Constitutional: No fever/chills. Positive lightheadedness.  Eyes: No visual changes. ENT: No sore throat. Cardiovascular: Denies chest pain. Respiratory: Positive shortness of breath. Gastrointestinal: No abdominal pain.  No nausea, no vomiting.  No diarrhea.  No constipation. Genitourinary: Negative for dysuria. Musculoskeletal: Negative for back pain. Skin: Negative for rash. Neurological: Negative for headaches, focal weakness or numbness.  10-point ROS otherwise negative.  ____________________________________________   PHYSICAL EXAM:  VITAL SIGNS: ED Triage Vitals  Enc Vitals Group     BP 12/07/17 0636 (!) 172/104     Pulse Rate 12/07/17 0636 74     Resp 12/07/17 0636 16     Temp 12/07/17 0636 98.4 F (36.9 C)     Temp Source 12/07/17 0636 Oral     SpO2 12/07/17 0636 100 %     Weight 12/07/17 0633 165 lb (74.8 kg)     Height 12/07/17 0633 6' (1.829 m)     Pain Score 12/07/17 0633 0   Constitutional: Alert and oriented. Well appearing and in no acute distress. Eyes: Conjunctivae are normal.  Head: Atraumatic. Nose: No congestion/rhinnorhea. Mouth/Throat: Mucous membranes are moist.  Oropharynx non-erythematous. Neck: No stridor. Cardiovascular: Normal rate, regular rhythm. Good peripheral circulation. Grossly normal heart sounds.   Respiratory: Normal respiratory effort.  No retractions. Lungs CTAB. Gastrointestinal: Soft and nontender. No distention.  Musculoskeletal: No lower extremity tenderness nor edema. No gross deformities of extremities. Neurologic:  Normal speech and language.  No gross focal neurologic deficits are appreciated.  Skin:  Skin is warm, dry and intact. No rash noted.  ____________________________________________   LABS (all labs ordered are listed, but only abnormal results are displayed)  Labs Reviewed  COMPREHENSIVE METABOLIC PANEL - Abnormal; Notable for the following components:      Result Value   Glucose, Bld 123 (*)     All other components within normal limits  CBC - Abnormal; Notable for the following components:   Platelets 144 (*)    All other components within normal limits  D-DIMER, QUANTITATIVE (NOT AT [facility])  I-STAT TROPONIN, ED  TYPE AND SCREEN   ____________________________________________  EKG   EKG Interpretation  Date/Time:  Monday December 07 2017 06:39:43 EDT Ventricular Rate:  64 PR Interval:    QRS Duration: 100 QT Interval:  404 QTC Calculation: 417 R Axis:   44 Text Interpretation:  Sinus rhythm No significant change since last tracing Confirmed by Rochele Raring [phone]) on 12/07/2017 6:43:12 AM       ____________________________________________  RADIOLOGY  Dg Chest 2 View  Result Date: 12/07/2017 CLINICAL DATA:  Shortness of breath.  Syncopal episode. EXAM: CHEST - 2 VIEW COMPARISON:  None. FINDINGS: Cardiomediastinal silhouette is normal. Mediastinal contours appear intact. There is no evidence of focal airspace consolidation, pleural effusion or pneumothorax. Osseous structures are without acute abnormality. Soft tissues are grossly normal. IMPRESSION: No active cardiopulmonary disease. Electronically Signed   By: Ted Mcalpine M.D.   On: 12/07/2017 07:19    ____________________________________________   PROCEDURES  Procedure(s) performed:   Procedures  None ____________________________________________   INITIAL IMPRESSION / ASSESSMENT AND PLAN / ED COURSE  Pertinent labs & imaging results that were available during my care of the patient were reviewed by me and considered in my medical decision making (see chart for details).  Patient presents to the emergency department for evaluation of lightheadedness with some associated dyspnea.  He is afebrile.  Vital signs are normal except for elevated blood pressure.  His CBC resulted showing hemoglobin of 14.5.  No clinical evidence to suggest acute GI bleeding as the cause of the patient's symptoms.  Troponin  and EKG are unremarkable so doubt atypical ACS.  I did add a d-dimer given the patient's shortness of breath and subjective lightheadedness.  Will give IV fluids and albuterol nebulizer.  Discussed need for smoking cessation.  09:20 AM She is feeling better after IV fluids and nebulizer treatment.  Will discharge home with albuterol inhaler and instructions to increase his fluid intake.  Advised that he cut back on soda and or alcohol.  I also discussed the need for smoking cessation as I feel his shortness of breath may be caused by his smoking.  I provided resources at discharge to assist with this and also provided contact information for a primary care physician in the area.  Discussed return precautions with him in detail.   At this time, I do not feel there is any life-threatening condition present. I have reviewed and discussed all results (EKG, imaging, lab, urine as appropriate), exam findings with patient. I have reviewed nursing notes and appropriate previous records.  I feel the patient is safe to be discharged home without further emergent workup. Discussed usual and customary return precautions. Patient and family (if present) verbalize understanding and are comfortable with this plan.  Patient will follow-up with their primary care provider. If they do not have a primary care provider, information for follow-up has been provided to them. All  questions have been answered.  ____________________________________________  FINAL CLINICAL IMPRESSION(S) / ED DIAGNOSES  Final diagnoses:  Lightheadedness  Shortness of breath     MEDICATIONS GIVEN DURING THIS VISIT:  Medications  sodium chloride 0.9 % bolus 1,000 mL (1,000 mLs Intravenous New Bag/Given 12/07/17 0823)  albuterol (PROVENTIL) (2.5 MG/3ML) 0.083% nebulizer solution 2.5 mg (2.5 mg Nebulization Given 12/07/17 0823)     NEW OUTPATIENT MEDICATIONS STARTED DURING THIS VISIT:  New Prescriptions   ALBUTEROL (PROVENTIL HFA;VENTOLIN  HFA) 108 (90 BASE) MCG/ACT INHALER    Inhale 1-2 puffs into the lungs every 6 (six) hours as needed for wheezing or shortness of breath.    Note:  This document was prepared using Dragon voice recognition software and may include unintentional dictation errors.  Alona Bene, MD Emergency Medicine    Long, Arlyss Repress, MD 12/07/17 [phone]

## 2017-12-07 NOTE — ED Triage Notes (Addendum)
Patient c/o dizziness; states that the last time he was like this he needed a blood transfusion. Also, c/o SOB.

## 2017-12-07 NOTE — ED Notes (Signed)
Patient verbalizes understanding of discharge instructions. Opportunity for questioning and answers were provided. Armband removed by staff, pt discharged from ED.  

## 2017-12-07 NOTE — ED Notes (Signed)
Pt. To XRAY via stretcher. 

## 2017-12-07 NOTE — Discharge Instructions (Signed)
You were seen in the ED today with lightheadedness and shortness of breath. I have sent an albuterol medication to your pharmacy which should be ready to pick up later today. Call the PCP listed to schedule a follow up appointment. Return to the ED with any new or worsening symptoms.

## 2017-12-08 ENCOUNTER — Encounter: Payer: Self-pay | Admitting: Gastroenterology

## 2017-12-08 ENCOUNTER — Ambulatory Visit (INDEPENDENT_AMBULATORY_CARE_PROVIDER_SITE_OTHER): Payer: PRIVATE HEALTH INSURANCE | Admitting: Gastroenterology

## 2017-12-08 VITALS — BP 148/90 | HR 62 | Ht 72.0 in | Wt 169.0 lb

## 2017-12-08 DIAGNOSIS — K640 First degree hemorrhoids: Secondary | ICD-10-CM

## 2017-12-08 NOTE — Progress Notes (Signed)
42 y/o male here for follow up. Previously had severe anemia with rectal bleeding due to hemorrhoids, history of ulcer treated with H pylori. Had banding on LL 10/16/17 with a good result initially. He's had some recurrence of bleeding more recently, although very mild. He wanted to proceed with further therapy today after discussion of options.   PROCEDURE NOTE: The patient presents with symptomatic grade I  hemorrhoids, requesting rubber band ligation of his/her hemorrhoidal disease.  All risks, benefits and alternative forms of therapy were described and informed consent was obtained.   The anorectum was pre-medicated with 0.125% nitroglycerin The decision was made to band the RP internal hemorrhoid, and the [facility] System was used to perform band ligation without complication.  Digital anorectal examination was then performed to assure proper positioning of the band, and to adjust the banded tissue as required.  The patient was discharged home without pain or other issues.  Dietary and behavioral recommendations were given and along with follow-up instructions.      The patient will return in 2-4 for  follow-up and possible additional banding as required. No complications were encountered and the patient tolerated the procedure well.  Ileene Patrick, MD [facility]

## 2017-12-08 NOTE — Patient Instructions (Signed)
You have been scheduled for your 3rd banding on 01/19/18 at 4:00 pm. If you feel completely well after your 2nd banding, you may cancel this appointment.  HEMORRHOID BANDING PROCEDURE    FOLLOW-UP CARE   1. The procedure you have had should have been relatively painless since the banding of the area involved does not have nerve endings and there is no pain sensation.  The rubber band cuts off the blood supply to the hemorrhoid and the band may fall off as soon as 48 hours after the banding (the band may occasionally be seen in the toilet bowl following a bowel movement). You may notice a temporary feeling of fullness in the rectum which should respond adequately to plain Tylenol or Motrin.  2. Following the banding, avoid strenuous exercise that evening and resume full activity the next day.  A sitz bath (soaking in a warm tub) or bidet is soothing, and can be useful for cleansing the area after bowel movements.     3. To avoid constipation, take two tablespoons of natural wheat bran, natural oat bran, flax, Benefiber or any over the counter fiber supplement and increase your water intake to 7-8 glasses daily.    4. Unless you have been prescribed anorectal medication, do not put anything inside your rectum for two weeks: No suppositories, enemas, fingers, etc.  5. Occasionally, you may have more bleeding than usual after the banding procedure.  This is often from the untreated hemorrhoids rather than the treated one.  Don't be concerned if there is a tablespoon or so of blood.  If there is more blood than this, lie flat with your bottom higher than your head and apply an ice pack to the area. If the bleeding does not stop within a half an hour or if you feel faint, call our office at [phone] or go to the emergency room.  6. Problems are not common; however, if there is a substantial amount of bleeding, severe pain, chills, fever or difficulty passing urine (very rare) or other problems,  you should call us at [phone] or report to the nearest emergency room.  7. Do not stay seated continuously for more than 2-3 hours for a day or two after the procedure.  Tighten your buttock muscles 10-15 times every two hours and take 10-15 deep breaths every 1-2 hours.  Do not spend more than a few minutes on the toilet if you cannot empty your bowel; instead re-visit the toilet at a later time.    If you are age 78 or older, your body mass index should be between 23-30. Your Body mass index is 22.92 kg/m. If this is out of the aforementioned range listed, please consider follow up with your Primary Care Provider.  If you are age 5 or younger, your body mass index should be between 19-25. Your Body mass index is 22.92 kg/m. If this is out of the aformentioned range listed, please consider follow up with your Primary Care Provider.

## 2017-12-13 ENCOUNTER — Emergency Department (HOSPITAL_COMMUNITY): Payer: PRIVATE HEALTH INSURANCE

## 2017-12-13 ENCOUNTER — Emergency Department (HOSPITAL_COMMUNITY)
Admission: EM | Admit: 2017-12-13 | Discharge: 2017-12-13 | Disposition: A | Discharge status: Home / Self Care | Payer: PRIVATE HEALTH INSURANCE | Attending: Emergency Medicine | Admitting: Emergency Medicine

## 2017-12-13 ENCOUNTER — Encounter (HOSPITAL_COMMUNITY): Payer: Self-pay | Admitting: Emergency Medicine

## 2017-12-13 ENCOUNTER — Other Ambulatory Visit: Payer: Self-pay

## 2017-12-13 DIAGNOSIS — R109 Unspecified abdominal pain: Secondary | ICD-10-CM | POA: Diagnosis present

## 2017-12-13 DIAGNOSIS — K5792 Diverticulitis of intestine, part unspecified, without perforation or abscess without bleeding: Secondary | ICD-10-CM | POA: Insufficient documentation

## 2017-12-13 DIAGNOSIS — F17210 Nicotine dependence, cigarettes, uncomplicated: Secondary | ICD-10-CM | POA: Diagnosis not present

## 2017-12-13 LAB — CBC WITH DIFFERENTIAL/PLATELET
Abs Immature Granulocytes: 0 10*3/uL (ref 0.0–0.1)
BASOS ABS: 0 10*3/uL (ref 0.0–0.1)
BASOS PCT: 0 %
Eosinophils Absolute: 0 10*3/uL (ref 0.0–0.7)
Eosinophils Relative: 0 %
HCT: 41.8 % (ref 39.0–52.0)
HEMOGLOBIN: 14.1 g/dL (ref 13.0–17.0)
Immature Granulocytes: 1 %
LYMPHS PCT: 17 %
Lymphs Abs: 1.4 10*3/uL (ref 0.7–4.0)
MCH: 32.4 pg (ref 26.0–34.0)
MCHC: 33.7 g/dL (ref 30.0–36.0)
MCV: 96.1 fL (ref 78.0–100.0)
Monocytes Absolute: 1.4 10*3/uL — ABNORMAL HIGH (ref 0.1–1.0)
Monocytes Relative: 17 %
Neutro Abs: 5 10*3/uL (ref 1.7–7.7)
Neutrophils Relative %: 65 %
Platelets: 142 10*3/uL — ABNORMAL LOW (ref 150–400)
RBC: 4.35 MIL/uL (ref 4.22–5.81)
RDW: 12.5 % (ref 11.5–15.5)
WBC: 7.8 10*3/uL (ref 4.0–10.5)

## 2017-12-13 LAB — URINALYSIS, ROUTINE W REFLEX MICROSCOPIC
Bacteria, UA: NONE SEEN
Bilirubin Urine: NEGATIVE
GLUCOSE, UA: NEGATIVE mg/dL
KETONES UR: NEGATIVE mg/dL
Leukocytes, UA: NEGATIVE
Nitrite: NEGATIVE
PROTEIN: NEGATIVE mg/dL
Specific Gravity, Urine: 1.002 — ABNORMAL LOW (ref 1.005–1.030)
pH: 6 (ref 5.0–8.0)

## 2017-12-13 LAB — COMPREHENSIVE METABOLIC PANEL
ALK PHOS: 46 U/L (ref 38–126)
ALT: 19 U/L (ref 0–44)
ANION GAP: 14 (ref 5–15)
AST: 21 U/L (ref 15–41)
Albumin: 3.9 g/dL (ref 3.5–5.0)
BUN: 5 mg/dL — ABNORMAL LOW (ref 6–20)
CALCIUM: 9.4 mg/dL (ref 8.9–10.3)
CO2: 21 mmol/L — AB (ref 22–32)
Chloride: 98 mmol/L (ref 98–111)
Creatinine, Ser: 0.86 mg/dL (ref 0.61–1.24)
GFR calc non Af Amer: >60 mL/min (ref >60)
Glucose, Bld: 98 mg/dL (ref 70–99)
POTASSIUM: 3.5 mmol/L (ref 3.5–5.1)
SODIUM: 133 mmol/L — AB (ref 135–145)
TOTAL PROTEIN: 7.3 g/dL (ref 6.5–8.1)
Total Bilirubin: 1.2 mg/dL (ref 0.3–1.2)

## 2017-12-13 LAB — LIPASE, BLOOD: Lipase: 22 U/L (ref 11–51)

## 2017-12-13 LAB — I-STAT CG4 LACTIC ACID, ED: Lactic Acid, Venous: 1.43 mmol/L (ref 0.5–1.9)

## 2017-12-13 MED ORDER — CIPROFLOXACIN HCL 500 MG PO TABS
500.0000 mg | ORAL_TABLET | Freq: Once | ORAL | Status: AC
  Administered 2017-12-13: 500 mg via ORAL
  Filled 2017-12-13: qty 1

## 2017-12-13 MED ORDER — ONDANSETRON HCL 4 MG/2ML IJ SOLN
4.0000 mg | Freq: Once | INTRAMUSCULAR | Status: AC
  Administered 2017-12-13: 4 mg via INTRAVENOUS
  Filled 2017-12-13: qty 2

## 2017-12-13 MED ORDER — MORPHINE SULFATE (PF) 4 MG/ML IV SOLN
4.0000 mg | Freq: Once | INTRAVENOUS | Status: AC
  Administered 2017-12-13: 4 mg via INTRAVENOUS
  Filled 2017-12-13: qty 1

## 2017-12-13 MED ORDER — ONDANSETRON HCL 4 MG PO TABS: 4.0000 mg | ORAL_TABLET | Freq: Three times a day (TID) | ORAL | 0 refills | Status: DC | PRN

## 2017-12-13 MED ORDER — SODIUM CHLORIDE 0.9 % IV BOLUS
1000.0000 mL | Freq: Once | INTRAVENOUS | Status: AC
  Administered 2017-12-13: 1000 mL via INTRAVENOUS

## 2017-12-13 MED ORDER — METRONIDAZOLE 500 MG PO TABS: 500.0000 mg | ORAL_TABLET | Freq: Two times a day (BID) | ORAL | 0 refills | Status: AC

## 2017-12-13 MED ORDER — CIPROFLOXACIN HCL 500 MG PO TABS: 500.0000 mg | ORAL_TABLET | Freq: Two times a day (BID) | ORAL | 0 refills | Status: AC

## 2017-12-13 MED ORDER — OXYCODONE-ACETAMINOPHEN 5-325 MG PO TABS: 1.0000 | ORAL_TABLET | ORAL | 0 refills | Status: DC | PRN

## 2017-12-13 MED ORDER — IOHEXOL 300 MG/ML  SOLN
100.0000 mL | Freq: Once | INTRAMUSCULAR | Status: AC | PRN
  Administered 2017-12-13: 100 mL via INTRAVENOUS

## 2017-12-13 MED ORDER — GI COCKTAIL ~~LOC~~
30.0000 mL | Freq: Once | ORAL | Status: AC
  Administered 2017-12-13: 30 mL via ORAL
  Filled 2017-12-13: qty 30

## 2017-12-13 MED ORDER — METRONIDAZOLE 500 MG PO TABS
500.0000 mg | ORAL_TABLET | Freq: Once | ORAL | Status: AC
  Administered 2017-12-13: 500 mg via ORAL
  Filled 2017-12-13: qty 1

## 2017-12-13 MED ORDER — ONDANSETRON HCL 4 MG PO TABS
4.0000 mg | ORAL_TABLET | Freq: Once | ORAL | Status: AC
  Administered 2017-12-13: 4 mg via ORAL
  Filled 2017-12-13: qty 1

## 2017-12-13 MED ORDER — OXYCODONE-ACETAMINOPHEN 5-325 MG PO TABS
1.0000 | ORAL_TABLET | Freq: Once | ORAL | Status: AC
  Administered 2017-12-13: 1 via ORAL
  Filled 2017-12-13: qty 1

## 2017-12-13 NOTE — Discharge Instructions (Signed)
Your work-up today revealed diverticulitis.  There is no perforation or abscess thus, we feel you are safe for a trial of outpatient management at home.  Please take the antibiotics, pain medicine, and nausea medicine to maintain hydration and treat your symptoms.  Please follow-up with your primary doctor in the next several days for reassessment and further management.  If any symptoms change or worsen, please return to the nearest emergency department.

## 2017-12-13 NOTE — ED Provider Notes (Signed)
[facility] EMERGENCY DEPARTMENT Provider Note   CSN: [id number] Arrival date & time: 12/13/17  4696     History   Chief Complaint Chief Complaint  Patient presents with  . Abdominal Pain  . Flank Pain    HPI Shane Hicks is a 42 y.o. male.  The history is provided by the patient and medical records. No language interpreter was used.  Abdominal Pain   This is a new problem. The current episode started more than 2 days ago. The problem occurs constantly. The problem has been gradually worsening. The pain is associated with a previous surgery (recent rectal hemmhoroid banding). The pain is located in the generalized abdominal region. The quality of the pain is aching, sharp and burning. The pain is at a severity of 7/10. The pain is moderate. Associated symptoms include constipation. Pertinent negatives include fever, diarrhea, nausea, vomiting, dysuria, frequency and headaches. The symptoms are aggravated by palpation. Nothing relieves the symptoms.  Flank Pain  Associated symptoms include abdominal pain. Pertinent negatives include no chest pain, no headaches and no shortness of breath.    Past Medical History:  Diagnosis Date  . Pre-syncope     Patient Active Problem List   Diagnosis Date Noted  . Acute lower GI bleeding 07/19/2017  . Symptomatic anemia 07/18/2017  . GI bleed 07/18/2017  . Tobacco use 07/18/2017  . ETOH abuse 07/18/2017  . Pre-syncope   . NSAID long-term use     Past Surgical History:  Procedure Laterality Date  . COLONOSCOPY N/A 07/19/2017   Procedure: COLONOSCOPY;  Surgeon: Benancio Deeds, MD;  Location: [facility] ENDOSCOPY;  Service: Gastroenterology;  Laterality: N/A;  . ESOPHAGOGASTRODUODENOSCOPY N/A 07/19/2017   Procedure: ESOPHAGOGASTRODUODENOSCOPY (EGD);  Surgeon: Benancio Deeds, MD;  Location: [facility] ENDOSCOPY;  Service: Gastroenterology;  Laterality: N/A;        Home Medications    Prior to Admission medications     Medication Sig Start Date End Date Taking? Authorizing Provider  acetaminophen (TYLENOL) 325 MG tablet Take 2 tablets (650 mg total) by mouth every 6 (six) hours as needed for mild pain (or Fever >/= 101). 07/19/17   Myrtie Neither, MD  albuterol (PROVENTIL HFA;VENTOLIN HFA) 108 (90 Base) MCG/ACT inhaler Inhale 1-2 puffs into the lungs every 6 (six) hours as needed for wheezing or shortness of breath. 12/07/17   Long, Arlyss Repress, MD    Family History Family History  Problem Relation Age of Onset  . Hypertension Mother     Social History Social History   Tobacco Use  . Smoking status: Current Every Day Smoker    Packs/day: 1.00    Types: Cigarettes  . Smokeless tobacco: Never Used  Substance Use Topics  . Alcohol use: Yes    Comment: 3 to 5 beers a day  . Drug use: Not Currently     Allergies   Patient has no known allergies.   Review of Systems Review of Systems  Constitutional: Negative for chills, diaphoresis, fatigue and fever.  HENT: Negative for congestion.   Eyes: Negative for visual disturbance.  Respiratory: Negative for cough, chest tightness, shortness of breath, wheezing and stridor.   Cardiovascular: Negative for chest pain and palpitations.  Gastrointestinal: Positive for abdominal pain, blood in stool (recently, not now) and constipation. Negative for abdominal distention, diarrhea, nausea and vomiting.  Genitourinary: Positive for flank pain. Negative for dysuria and frequency.  Musculoskeletal: Positive for back pain. Negative for neck pain and neck stiffness.  Neurological: Negative for light-headedness  and headaches.  Psychiatric/Behavioral: Negative for agitation.  All other systems reviewed and are negative.    Physical Exam Updated Vital Signs BP (!) 159/91   Pulse 73   Temp 98.8 F (37.1 C) (Oral)   Ht 6' (1.829 m)   Wt 74.8 kg   SpO2 100%   BMI 22.38 kg/m   Physical Exam  Constitutional: He is oriented to person, place, and time. He  appears well-developed and well-nourished. No distress.  HENT:  Head: Normocephalic and atraumatic.  Mouth/Throat: Oropharynx is clear and moist. No oropharyngeal exudate.  Eyes: Pupils are equal, round, and reactive to light. Conjunctivae and EOM are normal.  Neck: Neck supple.  Cardiovascular: Normal rate and regular rhythm.  No murmur heard. Pulmonary/Chest: Effort normal and breath sounds normal. No respiratory distress. He has no wheezes. He has no rales. He exhibits no tenderness.  Abdominal: Soft. Normal appearance. There is generalized tenderness. There is no rigidity, no rebound, no guarding and no CVA tenderness.    Musculoskeletal: He exhibits tenderness. He exhibits no edema.       Lumbar back: He exhibits tenderness and pain.       Back:  Neurological: He is alert and oriented to person, place, and time. No sensory deficit. He exhibits normal muscle tone.  Skin: Skin is warm and dry. He is not diaphoretic. No erythema. No pallor.  Psychiatric: He has a normal mood and affect.  Nursing note and vitals reviewed.    ED Treatments / Results  Labs (all labs ordered are listed, but only abnormal results are displayed) Labs Reviewed  URINALYSIS, ROUTINE W REFLEX MICROSCOPIC - Abnormal; Notable for the following components:      Result Value   Specific Gravity, Urine 1.002 (*)    Hgb urine dipstick SMALL (*)    All other components within normal limits  CBC WITH DIFFERENTIAL/PLATELET - Abnormal; Notable for the following components:   Platelets 142 (*)    Monocytes Absolute 1.4 (*)    All other components within normal limits  COMPREHENSIVE METABOLIC PANEL - Abnormal; Notable for the following components:   Sodium 133 (*)    CO2 21 (*)    BUN 5 (*)    All other components within normal limits  LIPASE, BLOOD  I-STAT CG4 LACTIC ACID, ED    EKG None  Radiology Ct Abdomen Pelvis W Contrast  Result Date: 12/13/2017 CLINICAL DATA:  Lower abdominal pain since Friday  EXAM: CT ABDOMEN AND PELVIS WITH CONTRAST TECHNIQUE: Multidetector CT imaging of the abdomen and pelvis was performed using the standard protocol following bolus administration of intravenous contrast. CONTRAST:  OMNIPAQUE IOHEXOL 300 MG/ML  SOLN COMPARISON:  03/05/2005 FINDINGS: Lower chest: Lung bases are clear. Hepatobiliary: Liver is within normal limits. Gallbladder is unremarkable. No intrahepatic or extrahepatic ductal dilatation. Pancreas: Within normal limits. Spleen: Within normal limits. Adrenals/Urinary Tract: Adrenal glands within normal limits. Kidneys are within normal limits.  No hydronephrosis. Bladder is within limits. Stomach/Bowel: Stomach is within normal limits. No evidence of bowel obstruction. Normal appendix (series 3/image 50). Extensive colonic diverticulosis. Acute diverticulitis at the hepatic flexure (series 3/image 31) as well as the descending colon in the left mid abdomen (series 3/image 41). No drainable fluid collection/abscess. No free air to suggest macroscopic perforation. Vascular/Lymphatic: No evidence of abdominal aortic aneurysm. No suspicious abdominopelvic lymphadenopathy. Reproductive: Prostate is unremarkable. Other: No abdominopelvic ascites. Musculoskeletal: Mild degenerative changes of the lumbar spine. IMPRESSION: Acute diverticulitis involving the hepatic flexure and the descending colon.  No drainable fluid collection/abscess. No free air to suggest macroscopic perforation. Electronically Signed   By: Charline BillsSriyesh  Krishnan M.D.   On: 12/13/2017 11:56    Procedures Procedures (including critical care time)  Medications Ordered in ED Medications  morphine 4 MG/ML injection 4 mg (4 mg Intravenous Given 12/13/17 0837)  gi cocktail (Maalox,Lidocaine,Donnatal) (30 mLs Oral Given 12/13/17 0837)  sodium chloride 0.9 % bolus 1,000 mL (0 mLs Intravenous Stopped 12/13/17 0944)  ondansetron (ZOFRAN) injection 4 mg (4 mg Intravenous Given 12/13/17 0837)  iohexol  (OMNIPAQUE) 300 MG/ML solution 100 mL (100 mLs Intravenous Contrast Given 12/13/17 1128)  ciprofloxacin (CIPRO) tablet 500 mg (500 mg Oral Given 12/13/17 1351)  metroNIDAZOLE (FLAGYL) tablet 500 mg (500 mg Oral Given 12/13/17 1351)  ondansetron (ZOFRAN) tablet 4 mg (4 mg Oral Given 12/13/17 1351)  oxyCODONE-acetaminophen (PERCOCET/ROXICET) 5-325 MG per tablet 1 tablet (1 tablet Oral Given 12/13/17 1351)     Initial Impression / Assessment and Plan / ED Course  I have reviewed the triage vital signs and the nursing notes.  Pertinent labs & imaging results that were available during my care of the patient were reviewed by me and considered in my medical decision making (see chart for details).     Colon FlatteryDamone Hicks is a 42 y.o. male with a past medical history significant for prior GI bleed with symptomatic anemia and recent internal hemorrhoid banding 4 days ago who presents with severe abdominal pain.  Patient reports that he had rectal bleeding 5 days ago and was seen by GI 4 days ago and they did a internal hemorrhoid banding procedure.  He reports that the next day he started having abdominal pain that has continued.  He reports is now a 7 out of 10 diffuse abdominal pain that is worse with movement sitting up or deep breathing.  He reports the abdominal pain is pleuritic.  He describes as a sharp and burning pain.  He reports he has been able to eat and drink and has not had significant nausea or vomiting.  He reports that he has had some constipation but has had some bowel movement that does not show bleeding.  There is instead fat and tissue like bowel movements.  He denies any rectal pain at this time.  He denies any GU symptoms.  No dysuria hematuria or change in urine.  He reports that he is also having low back pain.  He reports no relief with Dulcolax which she has taken.  He denies any other traumas.  He denies fever or chills.  No congestion, cough, or shortness of breath.  He says he had this pain  in the past but never this severe.  On exam, abdomen is diffusely tender.  Bowel sounds were present.  Patient also had low back tenderness but no CVA tenderness.  Patient had no rebound tenderness or guarding.  Patient refused GU exam and did not want rectal exam given his lack of rectal symptoms by report.  Lungs were clear and chest was nontender.  Patient otherwise appears uncomfortable from abdominal pain.  Clinically suspect patient may have pain related to constipation after having a banding procedure performed.  However, given the patient's pleuritic abdominal pain, peritonitis is considered and patient will have work-up to look for perforation or complication after her recent procedure.  Patient be given pain medicine, nausea medicine, fluids, and a GI cocktail.  He will have lab testing and CT imaging to further evaluate.    Anticipate reassessment after work-up.  Diagnostic work-up revealed evidence of diverticulitis with no perforation or abscess.    Patient given oral antibiotics.  Patient was able to demonstrate stability and was able to tolerate eating and drinking.     Patient was felt to be appropriate for outpatient management of diverticulitis.  Patient understands return precautions for new or worsened symptoms or inability to maintain hydration.  Patient discharged in good condition with prescription for Cipro, Flagyl, pain medicine and nausea medicine.  Patient discharged in good condition.   Final Clinical Impressions(s) / ED Diagnoses   Final diagnoses:  Diverticulitis    ED Discharge Orders         Ordered    ciprofloxacin (CIPRO) 500 MG tablet  2 times daily     12/13/17 1448    metroNIDAZOLE (FLAGYL) 500 MG tablet  2 times daily     12/13/17 1448    oxyCODONE-acetaminophen (PERCOCET/ROXICET) 5-325 MG tablet  Every 4 hours PRN     12/13/17 1448    ondansetron (ZOFRAN) 4 MG tablet  Every 8 hours PRN     12/13/17 1448          Clinical Impression: 1.  Diverticulitis     Disposition: Discharge  Condition: Good  I have discussed the results, Dx and Tx plan with the pt(& family if present). He/she/they expressed understanding and agree(s) with the plan. Discharge instructions discussed at great length. Strict return precautions discussed and pt &/or family have verbalized understanding of the instructions. No further questions at time of discharge.    Discharge Medication List as of 12/13/2017  2:49 PM    START taking these medications   Details  ciprofloxacin (CIPRO) 500 MG tablet Take 1 tablet (500 mg total) by mouth 2 (two) times daily for 10 days., Starting Sun 12/13/2017, Until Wed 12/23/2017, Print    metroNIDAZOLE (FLAGYL) 500 MG tablet Take 1 tablet (500 mg total) by mouth 2 (two) times daily for 10 days., Starting Sun 12/13/2017, Until Wed 12/23/2017, Print    ondansetron (ZOFRAN) 4 MG tablet Take 1 tablet (4 mg total) by mouth every 8 (eight) hours as needed., Starting Sun 12/13/2017, Print    oxyCODONE-acetaminophen (PERCOCET/ROXICET) 5-325 MG tablet Take 1 tablet by mouth every 4 (four) hours as needed for severe pain., Starting Sun 12/13/2017, Print        Follow Up: [facility] [address] [place] [address] [phone] Schedule an appointment as soon as possible for a visit    [facility] EMERGENCY DEPARTMENT [address] [id number] mc [place] [address] [phone]       Raul Torrance, Canary Brim, MD 12/13/17 [phone]

## 2017-12-13 NOTE — ED Notes (Signed)
Pt discharged from ED; instructions provided and scripts given; Pt encouraged to return to ED if symptoms worsen and to f/u with PCP; Pt verbalized understanding of all instructions 

## 2017-12-13 NOTE — ED Triage Notes (Signed)
Pt. Stated, Shane Hicks had burning in lower stomach and in the back area since Friday.

## 2018-01-06 ENCOUNTER — Encounter: Payer: PRIVATE HEALTH INSURANCE | Admitting: Gastroenterology

## 2018-01-19 ENCOUNTER — Encounter: Payer: PRIVATE HEALTH INSURANCE | Admitting: Gastroenterology

## 2018-01-19 ENCOUNTER — Telehealth: Payer: Self-pay

## 2018-01-19 NOTE — Telephone Encounter (Signed)
Lm for pt regarding his intention to have 3rd and final banding appt.He has cancelled/missed several appointments Asked him to call me to let me know.

## 2018-05-20 ENCOUNTER — Telehealth: Payer: Self-pay | Admitting: *Deleted

## 2018-05-20 ENCOUNTER — Emergency Department (HOSPITAL_COMMUNITY)
Admission: EM | Admit: 2018-05-20 | Discharge: 2018-05-20 | Disposition: A | Discharge status: Home / Self Care | Payer: PRIVATE HEALTH INSURANCE | Attending: Emergency Medicine | Admitting: Emergency Medicine

## 2018-05-20 ENCOUNTER — Other Ambulatory Visit: Payer: Self-pay

## 2018-05-20 ENCOUNTER — Encounter (HOSPITAL_COMMUNITY): Payer: Self-pay | Admitting: *Deleted

## 2018-05-20 DIAGNOSIS — F17210 Nicotine dependence, cigarettes, uncomplicated: Secondary | ICD-10-CM | POA: Diagnosis not present

## 2018-05-20 DIAGNOSIS — K625 Hemorrhage of anus and rectum: Secondary | ICD-10-CM | POA: Diagnosis present

## 2018-05-20 LAB — POC OCCULT BLOOD, ED: FECAL OCCULT BLD: POSITIVE — AB

## 2018-05-20 MED ORDER — DOCUSATE SODIUM 100 MG PO CAPS: 100.0000 mg | ORAL_CAPSULE | Freq: Two times a day (BID) | ORAL | 0 refills | Status: DC

## 2018-05-20 NOTE — Telephone Encounter (Signed)
===  View-only below this line=== ----- Message ----- From: Benancio Deeds, MD Sent: 05/20/2018  12:37 PM EST To: Cooper Render, CMA Subject: follow up                                      Beverely Low,  This patient was seen in the ED and needs a follow up with me at some point in the upcoming weeks. May need another banding, can put in a banding clot if needed. Thanks ----- Message ----- From: Rogers Blocker, RN Sent: 05/20/2018   9:54 AM EST To: Benancio Deeds, MD

## 2018-05-20 NOTE — Discharge Instructions (Signed)
1.  Take Colace twice a day to make sure your stool is staying soft and you do not need to push or strain to have a bowel movement.  Follow instructions in your discharge material for staying well-hydrated and high-fiber diet to avoid any problems with constipation.  Also follow instructions for sitz bath and hemorrhoid care. 2.  You likely have an internal hemorrhoid.  You must schedule a follow-up appointment with your gastroenterologist.  You were seen by Dr. Adela Lank his information is included in your discharge instructions.  It is important that you are seen to check on hemorrhoid but also to stay up-to-date on any screening needed to make sure you do not have or develop colon or rectal cancer.

## 2018-05-20 NOTE — ED Provider Notes (Signed)
[facility] EMERGENCY DEPARTMENT Provider Note   CSN: [id number] Arrival date & time: 05/20/18  0745    History   Chief Complaint Chief Complaint  Patient presents with  . Rectal Bleeding    HPI Shane Hicks is a 43 y.o. male.     HPI Patient reports he had hemorrhoid banding several months ago.  He reports he has been doing really well since that time.  He has not seen any blood in the stool.  He has not had problems with pain or bleeding.  He reports this morning he had a bowel movement and there was bright red blood with it.  He reports he also saw blood when he wiped.  He did not continue having bleeding after the bowel movement.  No associated pain.  This is the first time he has had bleeding or issues since his banding procedure. Past Medical History:  Diagnosis Date  . Pre-syncope     Patient Active Problem List   Diagnosis Date Noted  . Acute lower GI bleeding 07/19/2017  . Symptomatic anemia 07/18/2017  . GI bleed 07/18/2017  . Tobacco use 07/18/2017  . ETOH abuse 07/18/2017  . Pre-syncope   . NSAID long-term use     Past Surgical History:  Procedure Laterality Date  . COLONOSCOPY N/A 07/19/2017   Procedure: COLONOSCOPY;  Surgeon: Benancio Deeds, MD;  Location: [facility] ENDOSCOPY;  Service: Gastroenterology;  Laterality: N/A;  . ESOPHAGOGASTRODUODENOSCOPY N/A 07/19/2017   Procedure: ESOPHAGOGASTRODUODENOSCOPY (EGD);  Surgeon: Benancio Deeds, MD;  Location: [facility] ENDOSCOPY;  Service: Gastroenterology;  Laterality: N/A;        Home Medications    Prior to Admission medications   Medication Sig Start Date End Date Taking? Authorizing Provider  acetaminophen (TYLENOL) 325 MG tablet Take 2 tablets (650 mg total) by mouth every 6 (six) hours as needed for mild pain (or Fever >/= 101). 07/19/17   Myrtie Neither, MD  albuterol (PROVENTIL HFA;VENTOLIN HFA) 108 (90 Base) MCG/ACT inhaler Inhale 1-2 puffs into the lungs every 6 (six) hours as  needed for wheezing or shortness of breath. 12/07/17   Long, Arlyss Repress, MD  docusate sodium (COLACE) 100 MG capsule Take 1 capsule (100 mg total) by mouth every 12 (twelve) hours. 05/20/18   Arby Barrette, MD  ondansetron (ZOFRAN) 4 MG tablet Take 1 tablet (4 mg total) by mouth every 8 (eight) hours as needed. 12/13/17   Tegeler, Canary Brim, MD  oxyCODONE-acetaminophen (PERCOCET/ROXICET) 5-325 MG tablet Take 1 tablet by mouth every 4 (four) hours as needed for severe pain. 12/13/17   Tegeler, Canary Brim, MD    Family History Family History  Problem Relation Age of Onset  . Hypertension Mother     Social History Social History   Tobacco Use  . Smoking status: Current Every Day Smoker    Packs/day: 1.00    Types: Cigarettes  . Smokeless tobacco: Never Used  Substance Use Topics  . Alcohol use: Yes    Comment: 3 to 5 beers a day  . Drug use: Not Currently     Allergies   Patient has no known allergies.   Review of Systems Review of Systems 10 Systems reviewed and are negative for acute change except as noted in the HPI.   Physical Exam Updated Vital Signs BP (!) 157/101 (BP Location: Right Arm)   Pulse 82   Temp 98.8 F (37.1 C) (Oral)   Resp 16   Ht 6' (1.829 m)   [employer]  78.5 kg   SpO2 97%   BMI 23.46 kg/m   Physical Exam Constitutional:      Comments: Patient is alert and clinically well.  Nontoxic and alert.  HENT:     Mouth/Throat:     Mouth: Mucous membranes are moist.  Cardiovascular:     Rate and Rhythm: Normal rate and regular rhythm.     Pulses: Normal pulses.     Heart sounds: Normal heart sounds.  Pulmonary:     Effort: Pulmonary effort is normal.     Breath sounds: Normal breath sounds.  Abdominal:     General: There is no distension.     Palpations: Abdomen is soft.     Tenderness: There is no abdominal tenderness. There is no guarding.  Genitourinary:    Comments: Normal perianal exam.  No visible blood, hemorrhoid or fissure.  Digital exam,  suspect internal hemorrhoid to palpation.  Small ridge but not large obstructing or tender.  No stool in the vault.  No visible blood on glove from exam. Musculoskeletal: Normal range of motion.        General: No swelling.  Skin:    General: Skin is warm and dry.  Neurological:     General: No focal deficit present.     Mental Status: He is oriented to person, place, and time.     Coordination: Coordination normal.  Psychiatric:        Mood and Affect: Mood normal.      ED Treatments / Results  Labs (all labs ordered are listed, but only abnormal results are displayed) Labs Reviewed  POC OCCULT BLOOD, ED - Abnormal; Notable for the following components:      Result Value   Fecal Occult Bld POSITIVE (*)    All other components within normal limits    EKG None  Radiology No results found.  Procedures Procedures (including critical care time)  Medications Ordered in ED Medications - No data to display   Initial Impression / Assessment and Plan / ED Course  I have reviewed the triage vital signs and the nursing notes.  Pertinent labs & imaging results that were available during my care of the patient were reviewed by me and considered in my medical decision making (see chart for details).        Patient had rectal bleeding that just occurred this morning.  It was bright red with bowel movement.  No visible blood on exam and no visible external hemorrhoid however suspected a small internal hemorrhoid.  Patient is stable for continued GI work-up.  He is counseled on following up with Dr. Adela Lank.  He is counseled on necessity of a anoscopy or possibly colonoscopy to make sure that bleeding is not associated with any cancer.  Patient has had prior colonoscopy with Dr. Adela Lank.  Final Clinical Impressions(s) / ED Diagnoses   Final diagnoses:  Rectal bleeding    ED Discharge Orders         Ordered    docusate sodium (COLACE) 100 MG capsule  Every 12 hours      05/20/18 0919           Arby Barrette, MD 05/20/18 [phone]

## 2018-05-20 NOTE — Telephone Encounter (Signed)
Patient has been scheduled for an appointment 05/31/18 at 4pm. He verbalizes understanding and will call back to reschedule if necessary due to work.

## 2018-05-20 NOTE — ED Triage Notes (Signed)
Pt reports rectal bleeding that started this morning with a bowel movement. Pt reports hx of same. Has been on medication in the past for this several months ago.

## 2018-05-31 ENCOUNTER — Encounter: Payer: PRIVATE HEALTH INSURANCE | Admitting: Gastroenterology

## 2018-07-07 ENCOUNTER — Emergency Department (HOSPITAL_COMMUNITY)
Admission: EM | Admit: 2018-07-07 | Discharge: 2018-07-07 | Disposition: A | Discharge status: Home / Self Care | Payer: PRIVATE HEALTH INSURANCE | Attending: Emergency Medicine | Admitting: Emergency Medicine

## 2018-07-07 ENCOUNTER — Other Ambulatory Visit: Payer: Self-pay

## 2018-07-07 DIAGNOSIS — K625 Hemorrhage of anus and rectum: Secondary | ICD-10-CM | POA: Insufficient documentation

## 2018-07-07 DIAGNOSIS — K2970 Gastritis, unspecified, without bleeding: Secondary | ICD-10-CM | POA: Diagnosis not present

## 2018-07-07 DIAGNOSIS — K648 Other hemorrhoids: Secondary | ICD-10-CM | POA: Diagnosis not present

## 2018-07-07 DIAGNOSIS — Z87891 Personal history of nicotine dependence: Secondary | ICD-10-CM | POA: Insufficient documentation

## 2018-07-07 DIAGNOSIS — R1013 Epigastric pain: Secondary | ICD-10-CM | POA: Diagnosis not present

## 2018-07-07 LAB — CBC WITH DIFFERENTIAL/PLATELET
Abs Immature Granulocytes: 0.01 10*3/uL (ref 0.00–0.07)
Basophils Absolute: 0 10*3/uL (ref 0.0–0.1)
Basophils Relative: 1 %
Eosinophils Absolute: 0.1 10*3/uL (ref 0.0–0.5)
Eosinophils Relative: 2 %
HCT: 40.3 % (ref 39.0–52.0)
Hemoglobin: 13.9 g/dL (ref 13.0–17.0)
Immature Granulocytes: 0 %
Lymphocytes Relative: 44 %
Lymphs Abs: 1.3 10*3/uL (ref 0.7–4.0)
MCH: 30.8 pg (ref 26.0–34.0)
MCHC: 34.5 g/dL (ref 30.0–36.0)
MCV: 89.4 fL (ref 80.0–100.0)
Monocytes Absolute: 0.4 10*3/uL (ref 0.1–1.0)
Monocytes Relative: 15 %
Neutro Abs: 1.1 10*3/uL — ABNORMAL LOW (ref 1.7–7.7)
Neutrophils Relative %: 38 %
Platelets: 129 10*3/uL — ABNORMAL LOW (ref 150–400)
RBC: 4.51 MIL/uL (ref 4.22–5.81)
RDW: 12 % (ref 11.5–15.5)
WBC: 3 10*3/uL — ABNORMAL LOW (ref 4.0–10.5)
nRBC: 0 % (ref 0.0–0.2)

## 2018-07-07 LAB — COMPREHENSIVE METABOLIC PANEL
ALT: 25 U/L (ref 0–44)
AST: 40 U/L (ref 15–41)
Albumin: 4.1 g/dL (ref 3.5–5.0)
Alkaline Phosphatase: 49 U/L (ref 38–126)
Anion gap: 13 (ref 5–15)
BUN: 5 mg/dL — ABNORMAL LOW (ref 6–20)
CO2: 22 mmol/L (ref 22–32)
Calcium: 8.7 mg/dL — ABNORMAL LOW (ref 8.9–10.3)
Chloride: 101 mmol/L (ref 98–111)
Creatinine, Ser: 0.8 mg/dL (ref 0.61–1.24)
GFR calc Af Amer: >60 mL/min (ref >60)
GFR calc non Af Amer: >60 mL/min (ref >60)
Glucose, Bld: 85 mg/dL (ref 70–99)
Potassium: 3.9 mmol/L (ref 3.5–5.1)
Sodium: 136 mmol/L (ref 135–145)
Total Bilirubin: 0.7 mg/dL (ref 0.3–1.2)
Total Protein: 7.2 g/dL (ref 6.5–8.1)

## 2018-07-07 LAB — LIPASE, BLOOD: Lipase: 30 U/L (ref 11–51)

## 2018-07-07 LAB — POC OCCULT BLOOD, ED: Fecal Occult Bld: NEGATIVE

## 2018-07-07 MED ORDER — ALUM & MAG HYDROXIDE-SIMETH 200-200-20 MG/5ML PO SUSP
30.0000 mL | Freq: Once | ORAL | Status: AC
  Administered 2018-07-07: 30 mL via ORAL
  Filled 2018-07-07: qty 30

## 2018-07-07 MED ORDER — FAMOTIDINE 20 MG PO TABS: 20.0000 mg | ORAL_TABLET | Freq: Once | ORAL | Status: DC

## 2018-07-07 MED ORDER — FAMOTIDINE 20 MG IN NS 100 ML IVPB
20.0000 mg | Freq: Once | INTRAVENOUS | Status: DC
  Administered 2018-07-07: 20 mg via INTRAVENOUS
  Filled 2018-07-07: qty 100

## 2018-07-07 MED ORDER — FAMOTIDINE 20 MG PO TABS: 20.0000 mg | ORAL_TABLET | Freq: Every day | ORAL | 0 refills | Status: AC

## 2018-07-07 MED ORDER — LIDOCAINE VISCOUS HCL 2 % MT SOLN
15.0000 mL | Freq: Once | OROMUCOSAL | Status: AC
  Administered 2018-07-07: 15 mL via ORAL
  Filled 2018-07-07: qty 15

## 2018-07-07 NOTE — ED Triage Notes (Addendum)
Pt here for for evaluation of rectal bleeding. Sts his chest and stomach have been "burning" but this morning he went to the bathroom and "all the poop was blood." Sees  GI for hemorrhoids, hx transfusion d/t gi bleeding.

## 2018-07-07 NOTE — ED Provider Notes (Signed)
[facility] EMERGENCY DEPARTMENT Provider Note   CSN: [id number] Arrival date & time: 07/07/18  1619    History   Chief Complaint Chief Complaint  Patient presents with  . Rectal Bleeding    HPI Shane Hicks is a 43 y.o. male presenting for evaluation of bright red blood per rectum and epigastric burning.  Patient states that the past 2 days, he has been having epigastric burning.  It comes and goes, is worse after eating.  He denies a history of reflux, but states that more frequently has been having to use baking soda to calm his stomach.  He does not take an antacid daily.  He states he had a bowel movement today, noticed a lot of bright red blood in his stool.  No blood in his underwear.  Patient states he has been having normal bowel movements.  He denies fevers, chills, chest pain, shortness of breath, nausea, vomiting, abnormal urination.  He is not on blood thinners.  Patient states he has needed transfusion for the eye bleed in the past.  He has had banding for several hemorrhoids, but knows that he has another internal hemorrhoid that still needs to be banded.  She sees Dr. Adela Lank with lower GI.  He states he has no other medical problems, takes no medications daily.  Pt drinks ~6 beers/day, has had 1 today. He denies tobacco or drug use.     HPI  Past Medical History:  Diagnosis Date  . Pre-syncope     Patient Active Problem List   Diagnosis Date Noted  . Acute lower GI bleeding 07/19/2017  . Symptomatic anemia 07/18/2017  . GI bleed 07/18/2017  . Tobacco use 07/18/2017  . ETOH abuse 07/18/2017  . Pre-syncope   . NSAID long-term use     Past Surgical History:  Procedure Laterality Date  . COLONOSCOPY N/A 07/19/2017   Procedure: COLONOSCOPY;  Surgeon: Benancio Deeds, MD;  Location: [facility] ENDOSCOPY;  Service: Gastroenterology;  Laterality: N/A;  . ESOPHAGOGASTRODUODENOSCOPY N/A 07/19/2017   Procedure: ESOPHAGOGASTRODUODENOSCOPY (EGD);   Surgeon: Benancio Deeds, MD;  Location: [facility] ENDOSCOPY;  Service: Gastroenterology;  Laterality: N/A;        Home Medications    Prior to Admission medications   Medication Sig Start Date End Date Taking? Authorizing Provider  acetaminophen (TYLENOL) 325 MG tablet Take 2 tablets (650 mg total) by mouth every 6 (six) hours as needed for mild pain (or Fever >/= 101). 07/19/17  Yes Myrtie Neither, MD  famotidine (PEPCID) 20 MG tablet Take 1 tablet (20 mg total) by mouth daily for 14 days. 07/07/18 07/21/18  Viaan Knippenberg, PA-C    Family History Family History  Problem Relation Age of Onset  . Hypertension Mother     Social History Social History   Tobacco Use  . Smoking status: Current Every Day Smoker    Packs/day: 1.00    Types: Cigarettes  . Smokeless tobacco: Never Used  Substance Use Topics  . Alcohol use: Yes    Comment: 3 to 5 beers a day  . Drug use: Not Currently     Allergies   Patient has no known allergies.   Review of Systems Review of Systems  Gastrointestinal: Positive for blood in stool.       Epigastric burning  All other systems reviewed and are negative.    Physical Exam Updated Vital Signs BP (!) 125/98   Pulse 78   Temp 98.3 F (36.8 C) (Oral)   Resp Marland Kitchen)  24   Ht 6' (1.829 m)   Wt 77.1 kg   SpO2 95%   BMI 23.06 kg/m   Physical Exam Vitals signs and nursing note reviewed. Exam conducted with a chaperone present.  Constitutional:      General: He is not in acute distress.    Appearance: He is well-developed.     Comments: Appears nontoxic  HENT:     Head: Normocephalic and atraumatic.  Eyes:     Conjunctiva/sclera: Conjunctivae normal.     Pupils: Pupils are equal, round, and reactive to light.  Neck:     Musculoskeletal: Normal range of motion and neck supple.  Cardiovascular:     Rate and Rhythm: Normal rate and regular rhythm.     Pulses: Normal pulses.  Pulmonary:     Effort: Pulmonary effort is normal. No  respiratory distress.     Breath sounds: Normal breath sounds. No wheezing.  Abdominal:     General: There is no distension.     Palpations: Abdomen is soft. There is no mass.     Tenderness: There is no abdominal tenderness. There is no guarding or rebound.     Comments: No TTP of the abd. Soft without rigidity, guarding, or distention. Negative rebound.   Genitourinary:    Rectum: Guaiac result negative. Internal hemorrhoid present.     Comments: Internal hemorrhoid palpated.  No gross blood on exam. No tenderness with exam Musculoskeletal: Normal range of motion.  Skin:    General: Skin is warm and dry.     Capillary Refill: Capillary refill takes less than 2 seconds.  Neurological:     Mental Status: He is alert and oriented to person, place, and time.      ED Treatments / Results  Labs (all labs ordered are listed, but only abnormal results are displayed) Labs Reviewed  CBC WITH DIFFERENTIAL/PLATELET - Abnormal; Notable for the following components:      Result Value   WBC 3.0 (*)    Platelets 129 (*)    Neutro Abs 1.1 (*)    All other components within normal limits  COMPREHENSIVE METABOLIC PANEL - Abnormal; Notable for the following components:   BUN 5 (*)    Calcium 8.7 (*)    All other components within normal limits  LIPASE, BLOOD  POC OCCULT BLOOD, ED    EKG EKG Interpretation  Date/Time:  Wednesday July 07 2018 16:33:30 EDT Ventricular Rate:  88 PR Interval:    QRS Duration: 93 QT Interval:  356 QTC Calculation: 431 R Axis:   53 Text Interpretation:  Sinus rhythm No significant change since last tracing Confirmed by Alvira Monday ([id number]) on 07/07/2018 5:16:33 PM   Radiology No results found.  Procedures Procedures (including critical care time)  Medications Ordered in ED Medications  famotidine (PEPCID) tablet 20 mg (has no administration in time range)  alum & mag hydroxide-simeth (MAALOX/MYLANTA) 200-200-20 MG/5ML suspension 30 mL (30 mLs  Oral Given 07/07/18 1738)    And  lidocaine (XYLOCAINE) 2 % viscous mouth solution 15 mL (15 mLs Oral Given 07/07/18 1738)     Initial Impression / Assessment and Plan / ED Course  I have reviewed the triage vital signs and the nursing notes.  Pertinent labs & imaging results that were available during my care of the patient were reviewed by me and considered in my medical decision making (see chart for details).        Patient presenting for evaluation of bright red blood per  rectum.  Physical exam reassuring, no active bleeding.  Internal hemorrhoid palpated, but not significantly tender and without active bleeding.  Patient also has epigastric burning, worse after eating.  History of daily alcohol use, consider gastritis.  Also consider pancreatitis, PUD, GERD, GI bleed.  Will obtain labs for further evaluation.  Will give viscous lidocaine and Pepcid for symptom control.  Labs reassuring, liver, pancreatic, renal function reassuring.  Hemoglobin stable.  White count mildly low at 3.0, doubt infection at this time, patient without fevers or other infectious symptoms.  Guaiac negative.  Bright red blood in stool likely due to internal hemorrhoid, discussed that this will continue until hemorrhoid is fixed with GI.  As it is not bleeding currently his hemoglobin is stable, I do not believe he needs emergent GI consult.  Patient reports epigastric burning improved with GI cocktail.  Discussed GERD versus PUD versus gastritis, and symptomatic treatment.  Discussed alcohol decrease and importance of diet.  Patient tolerating p.o. without difficulty.  As he has no abdominal tenderness, low suspicion for intra-abdominal infection, perforation, obstruction, or surgical abdomen.  I do not believe he needs CT at this time.  At this time, patient appears safe for discharge. Pt encouraged to f/u with GI. Return precautions given. Pt states he understands and agrees to plan.   Final Clinical Impressions(s) / ED  Diagnoses   Final diagnoses:  Gastritis, presence of bleeding unspecified, unspecified chronicity, unspecified gastritis type  BRBPR (bright red blood per rectum)  Internal hemorrhoid    ED Discharge Orders         Ordered    famotidine (PEPCID) 20 MG tablet  Daily     07/07/18 1835           Alveria ApleyCaccavale, Makyi Ledo, PA-C 07/07/18 1957    Alvira MondaySchlossman, Erin, MD 07/10/18 2129

## 2018-07-07 NOTE — Discharge Instructions (Addendum)
Take Pepcid to decrease stomach burning.  Be careful with your diet- there is information about this in the paperwork.  Decrease your alcohol intake, this is worsening your stomach symptoms.  Call your GI doctor for further management of your internal hemorrhoid.  You will likely have continued bleeding until the hemorrhoid gets banded.  If he start bleeding when you are not having a bowel movement, call your GI doctor for further instructions. Return to the emergency room if you develop high fevers, severe worsening abdominal pain, lightheadedness/weakness, or any new, worsening, or concerning symptoms.

## 2018-08-06 ENCOUNTER — Emergency Department (HOSPITAL_COMMUNITY)
Admission: EM | Admit: 2018-08-06 | Discharge: 2018-08-06 | Disposition: A | Discharge status: Home / Self Care | Payer: PRIVATE HEALTH INSURANCE | Attending: Emergency Medicine | Admitting: Emergency Medicine

## 2018-08-06 ENCOUNTER — Other Ambulatory Visit: Payer: Self-pay

## 2018-08-06 ENCOUNTER — Encounter (HOSPITAL_COMMUNITY): Payer: Self-pay | Admitting: Emergency Medicine

## 2018-08-06 DIAGNOSIS — K921 Melena: Secondary | ICD-10-CM | POA: Insufficient documentation

## 2018-08-06 DIAGNOSIS — F17210 Nicotine dependence, cigarettes, uncomplicated: Secondary | ICD-10-CM | POA: Diagnosis not present

## 2018-08-06 LAB — CBC
HCT: 40.2 % (ref 39.0–52.0)
Hemoglobin: 14.2 g/dL (ref 13.0–17.0)
MCH: 32 pg (ref 26.0–34.0)
MCHC: 35.3 g/dL (ref 30.0–36.0)
MCV: 90.5 fL (ref 80.0–100.0)
Platelets: 70 10*3/uL — ABNORMAL LOW (ref 150–400)
RBC: 4.44 MIL/uL (ref 4.22–5.81)
RDW: 12.4 % (ref 11.5–15.5)
WBC: 3.4 10*3/uL — ABNORMAL LOW (ref 4.0–10.5)
nRBC: 0 % (ref 0.0–0.2)

## 2018-08-06 NOTE — ED Triage Notes (Signed)
Pt here for blood in stool. Pt states that stool is not black. Pt states that he had a procedure to band some hemorrhoids about 4 months ago. Pt states that he is still seeing blood with stool. Pt states that he is having trouble getting an appointment with [facility] Nobody is calling him back.

## 2018-08-06 NOTE — ED Provider Notes (Signed)
[facility]ENC[facility] Provider Note   CSN: [id number] Arrival date & time: 08/06/18  1019    History   Chief Complaint Chief Complaint  Patient presents with  . Blood In Stools    HPI Shane Hicks is a 43 y.o. male.     The history is provided by the patient.  Rectal Bleeding  Quality: blood in stool over last 3 days, hx of hemorrhoids. Amount:  Scant Timing:  Intermittent Chronicity:  Recurrent Context: hemorrhoids   Similar prior episodes: yes   Relieved by:  Nothing Associated symptoms: no abdominal pain, no dizziness, no epistaxis, no fever, no hematemesis, no light-headedness, no loss of consciousness, no recent illness and no vomiting   Risk factors: no hx of IBD and no NSAID use     Past Medical History:  Diagnosis Date  . Pre-syncope     Patient Active Problem List   Diagnosis Date Noted  . Acute lower GI bleeding 07/19/2017  . Symptomatic anemia 07/18/2017  . GI bleed 07/18/2017  . Tobacco use 07/18/2017  . ETOH abuse 07/18/2017  . Pre-syncope   . NSAID long-term use     Past Surgical History:  Procedure Laterality Date  . COLONOSCOPY N/A 07/19/2017   Procedure: COLONOSCOPY;  Surgeon: Benancio DeedsArmbruster, Steven P, MD;  [facility];  Service: Gastroenterology;  Laterality: N/A;  . ESOPHAGOGASTRODUODENOSCOPY N/A 07/19/2017   Procedure: ESOPHAGOGASTRODUODENOSCOPY (EGD);  Surgeon: Benancio DeedsArmbruster, Steven P, MD;  [facility];  Service: Gastroenterology;  Laterality: N/A;        Home Medications    Prior to Admission medications   Medication Sig Start Date End Date Taking? Authorizing Provider  acetaminophen (TYLENOL) 325 MG tablet Take 2 tablets (650 mg total) by mouth every 6 (six) hours as needed for mild pain (or Fever >/= 101). 07/19/17   Myrtie NeitherUgah, Nwannadiya, MD  famotidine (PEPCID) 20 MG tablet Take 1 tablet (20 mg total) by mouth daily for 14 days. 07/07/18 07/21/18  Caccavale, Sophia, PA-C    Family History  Family History  Problem Relation Age of Onset  . Hypertension Mother     Social History Social History   Tobacco Use  . Smoking status: Current Every Day Smoker    Packs/day: 1.00    Types: Cigarettes  . Smokeless tobacco: Never Used  Substance Use Topics  . Alcohol use: Yes    Comment: 3 to 5 beers a day  . Drug use: Not Currently     Allergies   Patient has no known allergies.   Review of Systems Review of Systems  Constitutional: Negative for chills and fever.  HENT: Negative for ear pain, nosebleeds and sore throat.   Eyes: Negative for pain and visual disturbance.  Respiratory: Negative for cough and shortness of breath.   Cardiovascular: Negative for chest pain and palpitations.  Gastrointestinal: Positive for blood in stool and hematochezia. Negative for abdominal distention, abdominal pain, anal bleeding, constipation, diarrhea, hematemesis, nausea, rectal pain and vomiting.  Genitourinary: Negative for dysuria and hematuria.  Musculoskeletal: Negative for arthralgias and back pain.  Skin: Negative for color change and rash.  Neurological: Negative for dizziness, seizures, loss of consciousness, syncope and light-headedness.  All other systems reviewed and are negative.    Physical Exam Updated Vital Signs BP (!) 162/109 (BP Location: Right Arm)   Pulse 74   Temp 98.2 F (36.8 C) (Oral)   Resp 15   Ht 6' (1.829 m)   Wt 77.1 kg   SpO2 100%  BMI 23.06 kg/m   Physical Exam Vitals signs and nursing note reviewed.  Constitutional:      Appearance: He is well-developed.  HENT:     Head: Normocephalic and atraumatic.  Eyes:     Conjunctiva/sclera: Conjunctivae normal.  Neck:     Musculoskeletal: Neck supple.  Cardiovascular:     Rate and Rhythm: Normal rate and regular rhythm.     Heart sounds: No murmur.  Pulmonary:     Effort: Pulmonary effort is normal. No respiratory distress.     Breath sounds: Normal breath sounds.  Abdominal:      General: Abdomen is flat. There is no distension.     Palpations: Abdomen is soft.     Tenderness: There is no abdominal tenderness.  Genitourinary:    Rectum: Guaiac result negative.     Comments: No external hemorrhoids, Chicas stool on exam Skin:    General: Skin is warm and dry.  Neurological:     Mental Status: He is alert.      ED Treatments / Results  Labs (all labs ordered are listed, but only abnormal results are displayed) Labs Reviewed  CBC - Abnormal; Notable for the following components:      Result Value   WBC 3.4 (*)    Platelets 70 (*)    All other components within normal limits    EKG None  Radiology No results found.  Procedures Procedures (including critical care time)  Medications Ordered in ED Medications - No data to display   Initial Impression / Assessment and Plan / ED Course  I have reviewed the triage vital signs and the nursing notes.  Pertinent labs & imaging results that were available during my care of the patient were reviewed by me and considered in my medical decision making (see chart for details).     Shane Hicks is a 43 year old male with history of hemorrhoids who presents to the ED with blood in his stools.  Patient with normal vitals.  No fever.  Patient had history of hemorrhoids that were banded previously by GI.  Has needed blood transfusions in the past.  Over the last several days has noticed some blood intermixed with his stool.  Denies any black stools.  Has grossly Fehring stool on exam.  No abdominal tenderness.  No constipation.  No signs of external hemorrhoids on exam.  CBC shows no significant anemia.  Likely patient has some mild bleeding from hemorrhoids.  Recommend that he follow-up with GI.  Given discharge instructions and given return precautions.  This chart was dictated using voice recognition software.  Despite best efforts to proofread,  errors can occur which can change the documentation meaning.   Final  Clinical Impressions(s) / ED Diagnoses   Final diagnoses:  Blood in stool    ED Discharge Orders    None       Virgina Norfolk, DO 08/06/18 1614

## 2019-01-18 ENCOUNTER — Encounter (HOSPITAL_COMMUNITY): Payer: Self-pay

## 2019-01-18 ENCOUNTER — Emergency Department (HOSPITAL_COMMUNITY)
Admission: EM | Admit: 2019-01-18 | Discharge: 2019-01-19 | Disposition: A | Discharge status: Home / Self Care | Payer: PRIVATE HEALTH INSURANCE | Attending: Emergency Medicine | Admitting: Emergency Medicine

## 2019-01-18 ENCOUNTER — Other Ambulatory Visit: Payer: Self-pay

## 2019-01-18 DIAGNOSIS — F17210 Nicotine dependence, cigarettes, uncomplicated: Secondary | ICD-10-CM | POA: Insufficient documentation

## 2019-01-18 DIAGNOSIS — K625 Hemorrhage of anus and rectum: Secondary | ICD-10-CM | POA: Insufficient documentation

## 2019-01-18 DIAGNOSIS — Z8719 Personal history of other diseases of the digestive system: Secondary | ICD-10-CM | POA: Insufficient documentation

## 2019-01-18 LAB — COMPREHENSIVE METABOLIC PANEL
ALT: 25 U/L (ref 0–44)
AST: 31 U/L (ref 15–41)
Albumin: 4.1 g/dL (ref 3.5–5.0)
Alkaline Phosphatase: 49 U/L (ref 38–126)
Anion gap: 14 (ref 5–15)
BUN: 5 mg/dL — ABNORMAL LOW (ref 6–20)
CO2: 21 mmol/L — ABNORMAL LOW (ref 22–32)
Calcium: 8.8 mg/dL — ABNORMAL LOW (ref 8.9–10.3)
Chloride: 95 mmol/L — ABNORMAL LOW (ref 98–111)
Creatinine, Ser: 0.72 mg/dL (ref 0.61–1.24)
GFR calc Af Amer: >60 mL/min (ref >60)
GFR calc non Af Amer: >60 mL/min (ref >60)
Glucose, Bld: 99 mg/dL (ref 70–99)
Potassium: 3.9 mmol/L (ref 3.5–5.1)
Sodium: 130 mmol/L — ABNORMAL LOW (ref 135–145)
Total Bilirubin: 0.8 mg/dL (ref 0.3–1.2)
Total Protein: 7.3 g/dL (ref 6.5–8.1)

## 2019-01-18 LAB — CBC
HCT: 39.1 % (ref 39.0–52.0)
Hemoglobin: 14.6 g/dL (ref 13.0–17.0)
MCH: 33.7 pg (ref 26.0–34.0)
MCHC: 37.3 g/dL — ABNORMAL HIGH (ref 30.0–36.0)
MCV: 90.3 fL (ref 80.0–100.0)
Platelets: 168 10*3/uL (ref 150–400)
RBC: 4.33 MIL/uL (ref 4.22–5.81)
RDW: 11.7 % (ref 11.5–15.5)
WBC: 5.2 10*3/uL (ref 4.0–10.5)
nRBC: 0 % (ref 0.0–0.2)

## 2019-01-18 LAB — TYPE AND SCREEN
ABO/RH(D): A POS
Antibody Screen: NEGATIVE

## 2019-01-18 NOTE — ED Triage Notes (Signed)
Pt reports bright red blood in his stool today about 6-8times. Pt also reports abd pain, weakness and some dizziness.

## 2019-01-19 MED ORDER — HYDROCORTISONE ACETATE 25 MG RE SUPP: 25.0000 mg | Freq: Two times a day (BID) | RECTAL | 0 refills | Status: AC

## 2019-01-19 NOTE — Discharge Instructions (Signed)
Continue to drink plenty of water, eat a high fiber diet, and use hemorrhoidal creams/suppositories as needed.  If bleeding continues please return to have your blood counts rechecked.  If you have shortness of breath, lightheadedness, or fatigue/weakness, return to the ER.

## 2019-01-19 NOTE — ED Provider Notes (Signed)
[facility] EMERGENCY DEPARTMENT Provider Note   CSN: [id number] Arrival date & time: 01/18/19  1959     History   Chief Complaint Chief Complaint  Patient presents with  . GI Bleeding    HPI Shane Hicks is a 43 y.o. male.     Patient presents to the emergency department with a chief complaint of rectal bleeding.  He reports history of internal hemorrhoids.  States that he has had 6-8 bright red bloody bowel movements today.  He states that he has had hemorrhoidal banding in the past.  Has had colonoscopy, which has showed large internal hemorrhoids.  He he reports having needed blood transfusion in the past, and states when he saw the blood today, it concerned him enough to come to the emergency department.  He denies any lightheadedness, shortness of breath, or fatigue.  He denies straining or constipation.  Denies any other associated symptoms.  Denies any anal trauma/intercourse.  The history is provided by the patient. No language interpreter was used.    Past Medical History:  Diagnosis Date  . Pre-syncope     Patient Active Problem List   Diagnosis Date Noted  . Acute lower GI bleeding 07/19/2017  . Symptomatic anemia 07/18/2017  . GI bleed 07/18/2017  . Tobacco use 07/18/2017  . ETOH abuse 07/18/2017  . Pre-syncope   . NSAID long-term use     Past Surgical History:  Procedure Laterality Date  . COLONOSCOPY N/A 07/19/2017   Procedure: COLONOSCOPY;  Surgeon: Benancio Deeds, MD;  Location: [facility] ENDOSCOPY;  Service: Gastroenterology;  Laterality: N/A;  . ESOPHAGOGASTRODUODENOSCOPY N/A 07/19/2017   Procedure: ESOPHAGOGASTRODUODENOSCOPY (EGD);  Surgeon: Benancio Deeds, MD;  Location: [facility] ENDOSCOPY;  Service: Gastroenterology;  Laterality: N/A;        Home Medications    Prior to Admission medications   Medication Sig Start Date End Date Taking? Authorizing Provider  acetaminophen (TYLENOL) 325 MG tablet Take 2 tablets (650 mg total)  by mouth every 6 (six) hours as needed for mild pain (or Fever >/= 101). 07/19/17   Myrtie Neither, MD  famotidine (PEPCID) 20 MG tablet Take 1 tablet (20 mg total) by mouth daily for 14 days. 07/07/18 07/21/18  Caccavale, Sophia, PA-C  hydrocortisone (ANUSOL-HC) 25 MG suppository Place 1 suppository (25 mg total) rectally 2 (two) times daily. 01/19/19   Roxy Horseman, PA-C    Family History Family History  Problem Relation Age of Onset  . Hypertension Mother     Social History Social History   Tobacco Use  . Smoking status: Current Every Day Smoker    Packs/day: 1.00    Types: Cigarettes  . Smokeless tobacco: Never Used  Substance Use Topics  . Alcohol use: Yes    Comment: 3 to 5 beers a day  . Drug use: Not Currently     Allergies   Patient has no known allergies.   Review of Systems Review of Systems  All other systems reviewed and are negative.    Physical Exam Updated Vital Signs BP (!) 158/116 (BP Location: Left Arm)   Pulse 63   Temp 97.9 F (36.6 C)   Resp 20   Ht 6' (1.829 m)   Wt 79.4 kg   SpO2 98%   BMI 23.73 kg/m   Physical Exam Vitals signs and nursing note reviewed.  Constitutional:      General: He is not in acute distress.    Appearance: He is well-developed. He is not ill-appearing.  HENT:     Head: Normocephalic and atraumatic.  Eyes:     Conjunctiva/sclera: Conjunctivae normal.  Neck:     Musculoskeletal: Neck supple.  Cardiovascular:     Rate and Rhythm: Normal rate.  Pulmonary:     Effort: Pulmonary effort is normal. No respiratory distress.  Abdominal:     General: There is no distension.  Musculoskeletal:     Comments: Moves all extremities  Skin:    General: Skin is warm and dry.  Neurological:     Mental Status: He is alert and oriented to person, place, and time.  Psychiatric:        Mood and Affect: Mood normal.        Behavior: Behavior normal.      ED Treatments / Results  Labs (all labs ordered are listed,  but only abnormal results are displayed) Labs Reviewed  COMPREHENSIVE METABOLIC PANEL - Abnormal; Notable for the following components:      Result Value   Sodium 130 (*)    Chloride 95 (*)    CO2 21 (*)    BUN 5 (*)    Calcium 8.8 (*)    All other components within normal limits  CBC - Abnormal; Notable for the following components:   MCHC 37.3 (*)    All other components within normal limits  POC OCCULT BLOOD, ED  TYPE AND SCREEN    EKG None  Radiology No results found.  Procedures Procedures (including critical care time)  Medications Ordered in ED Medications - No data to display   Initial Impression / Assessment and Plan / ED Course  I have reviewed the triage vital signs and the nursing notes.  Pertinent labs & imaging results that were available during my care of the patient were reviewed by me and considered in my medical decision making (see chart for details).        Patient with rectal bleeding.  H&H is stable.  Vital signs are stable.  Has history of internal hemorrhoids.  Has had bright red blood per rectum today.  Will try rectal suppositories.  Have advised patient to continue to monitor symptoms for shortness of breath, weakness, or fatigue.  He will contact his GI.    Final Clinical Impressions(s) / ED Diagnoses   Final diagnoses:  Rectal bleeding    ED Discharge Orders         Ordered    hydrocortisone (ANUSOL-HC) 25 MG suppository  2 times daily     01/19/19 0306           Delwyn, Scoggin, PA-C 01/19/19 6270    Veryl Speak, MD 01/19/19 [phone]

## 2020-02-04 IMAGING — CT CT ABD-PELV W/ CM
2 of 5 series · 16 of 46 positions shown, 18 images · IV contrast (APPLIED)
Comparison: 03/05/2005

CLINICAL DATA: Lower abdominal pain since [REDACTED]

EXAM:
CT ABDOMEN AND PELVIS WITH CONTRAST
TECHNIQUE: Multidetector CT imaging of the abdomen and pelvis was performed
using the standard protocol following bolus administration of
intravenous contrast.
CONTRAST:  100mL OMNIPAQUE IOHEXOL 300 MG/ML  SOLN

[Series 3: abd/ pelvis 5.0 i30f 2 · axial · 0.73mm/px · z∈[+852,+1282]mm · 13 of 96 slices shown, 15 images]
[im 5/96  soft-tissue]
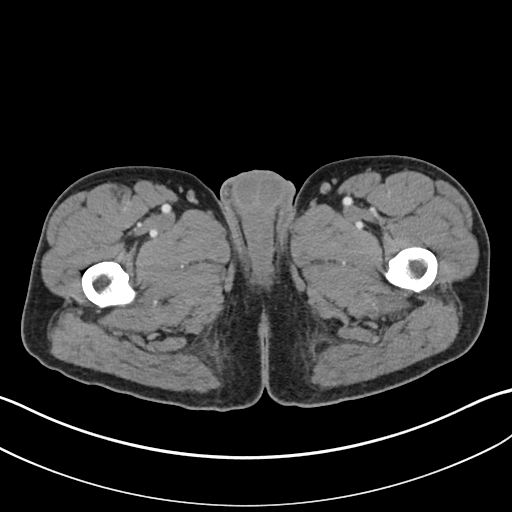
[im 5/96  bone]
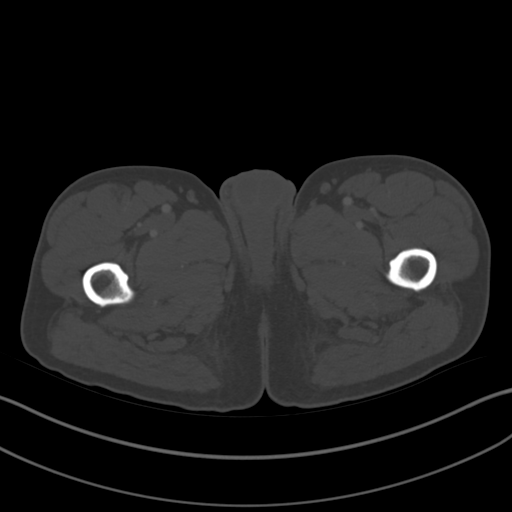
[im 15/96  soft-tissue]
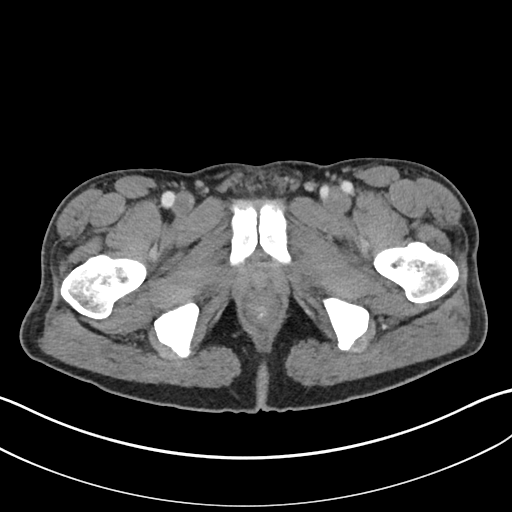
[im 20/96  soft-tissue]
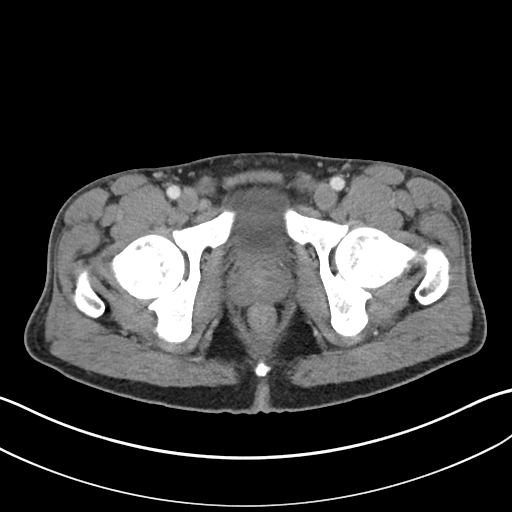
[im 29/96  soft-tissue]
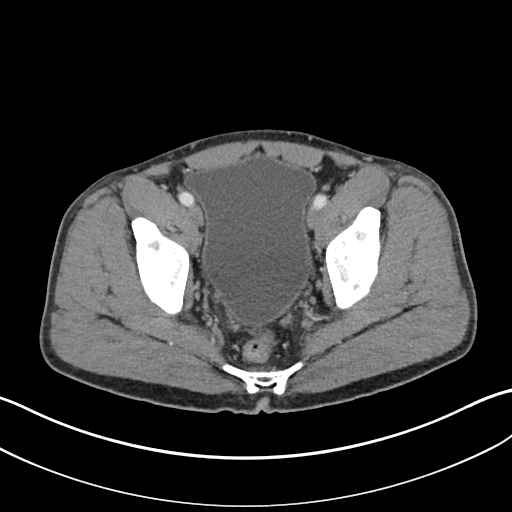
[im 34/96  soft-tissue]
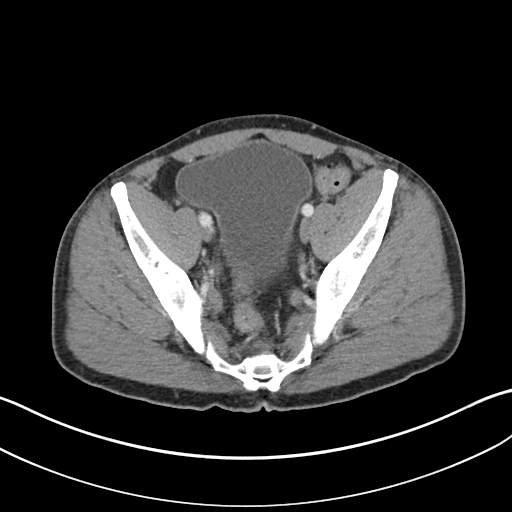
[im 43/96  soft-tissue]
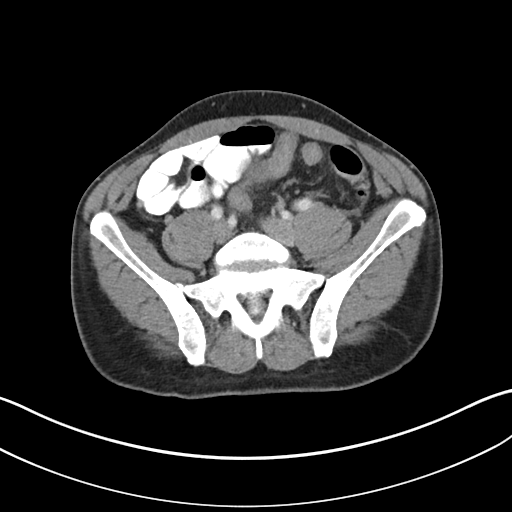
[im 48/96  soft-tissue]
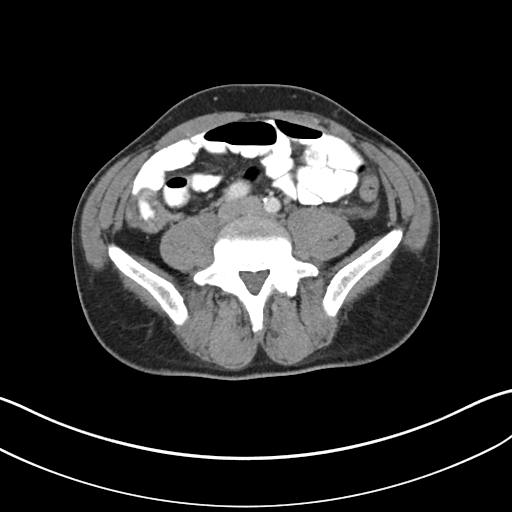
[im 53/96  soft-tissue]
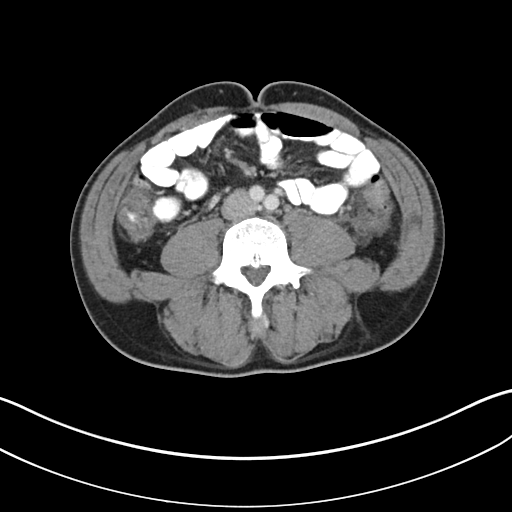
[im 62/96  soft-tissue]
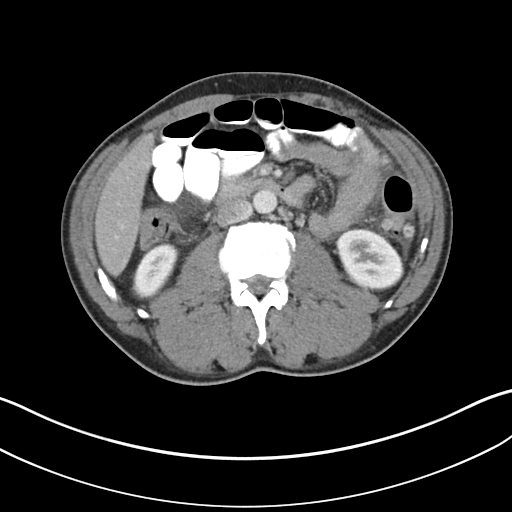
[im 62/96  bone]
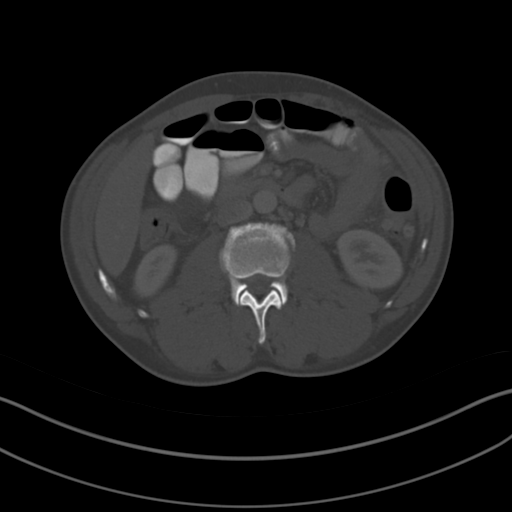
[im 67/96  soft-tissue]
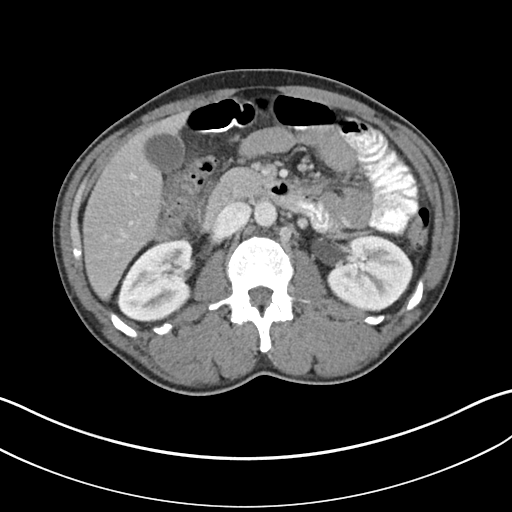
[im 77/96  soft-tissue]
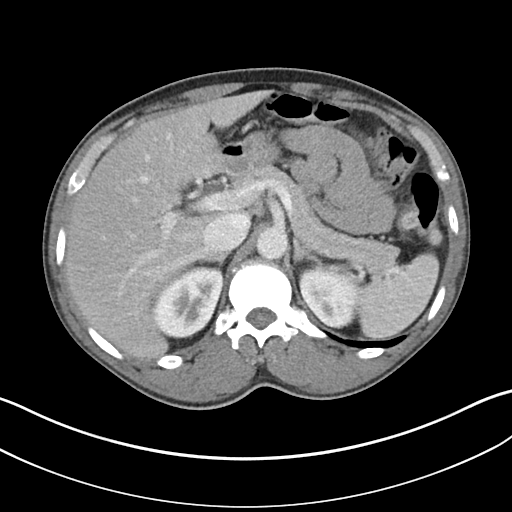
[im 81/96  soft-tissue]
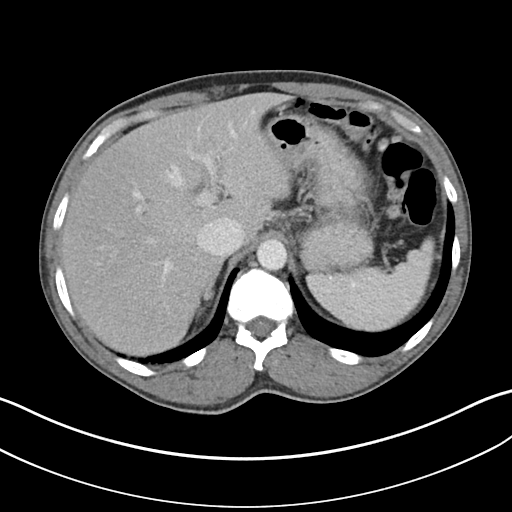
[im 91/96  soft-tissue]
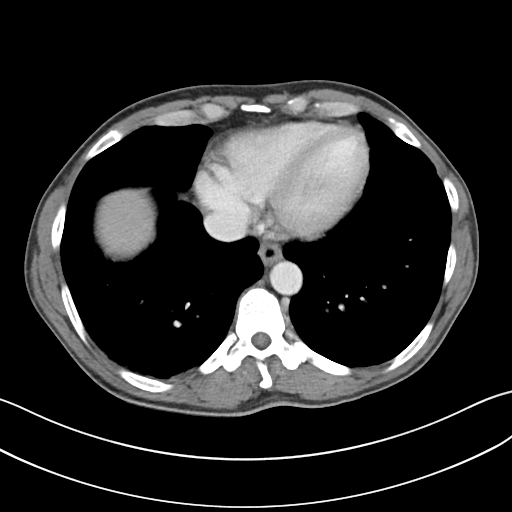

[Series 6: coronal soft tissue · coronal · 0.83mm/px · 3 of 101 slices shown]
[im 34/101  soft-tissue]
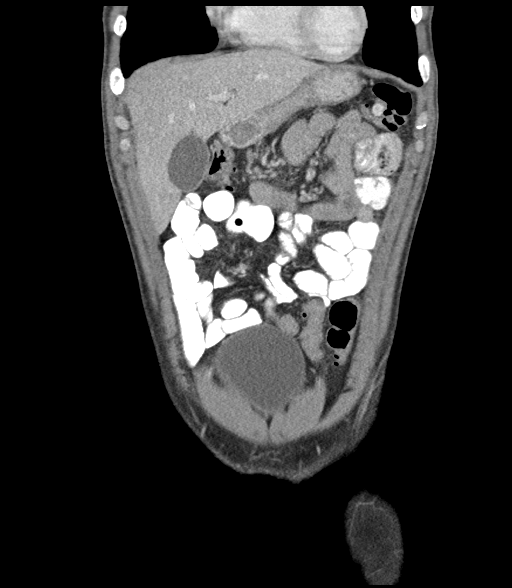
[im 45/101  soft-tissue]
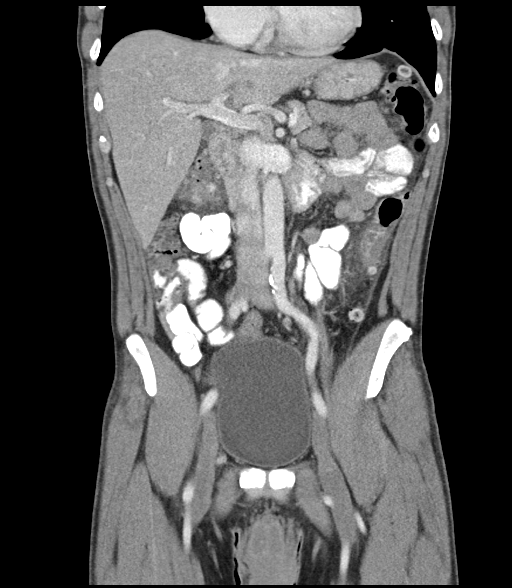
[im 56/101  soft-tissue]
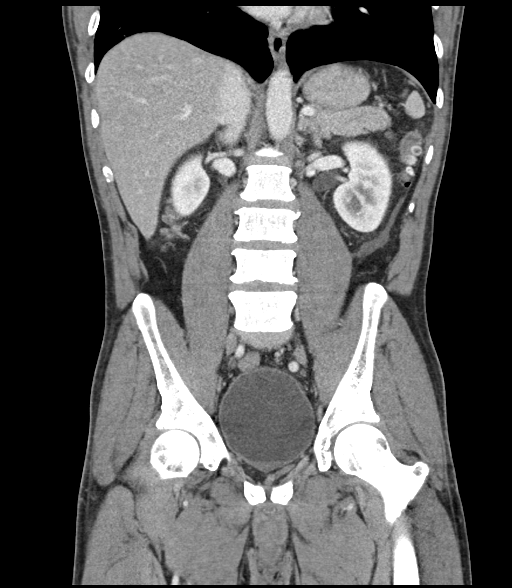

[16 of 46 positions shown; findings below may reference images not displayed]

FINDINGS: Lower chest: Lung bases are clear.

Hepatobiliary: Liver is within normal limits.

Gallbladder is unremarkable. No intrahepatic or extrahepatic ductal
dilatation.

Pancreas: Within normal limits.

Spleen: Within normal limits.

Adrenals/Urinary Tract: Adrenal glands within normal limits.

Kidneys are within normal limits.  No hydronephrosis.

Bladder is within limits.

Stomach/Bowel: Stomach is within normal limits.

No evidence of bowel obstruction.

Normal appendix (series 3/image 50).

Extensive colonic diverticulosis. Acute diverticulitis at the
hepatic flexure (series 3/image 31) as well as the descending colon
in the left mid abdomen (series 3/image 41).

No drainable fluid collection/abscess. No free air to suggest
macroscopic perforation.

Vascular/Lymphatic: No evidence of abdominal aortic aneurysm.

No suspicious abdominopelvic lymphadenopathy.

Reproductive: Prostate is unremarkable.

Other: No abdominopelvic ascites.

Musculoskeletal: Mild degenerative changes of the lumbar spine.
IMPRESSION: Acute diverticulitis involving the hepatic flexure and the
descending colon.

No drainable fluid collection/abscess. No free air to suggest
macroscopic perforation.

## 2023-09-19 ENCOUNTER — Emergency Department (HOSPITAL_COMMUNITY)
Admission: EM | Admit: 2023-09-19 | Discharge: 2023-09-20 | Discharge status: Against Medical Advice | Payer: Self-pay | Attending: Emergency Medicine | Admitting: Emergency Medicine

## 2023-09-19 DIAGNOSIS — X58XXXA Exposure to other specified factors, initial encounter: Secondary | ICD-10-CM | POA: Insufficient documentation

## 2023-09-19 DIAGNOSIS — S61212A Laceration without foreign body of right middle finger without damage to nail, initial encounter: Secondary | ICD-10-CM | POA: Insufficient documentation

## 2023-09-19 DIAGNOSIS — Y93G9 Activity, other involving cooking and grilling: Secondary | ICD-10-CM | POA: Insufficient documentation

## 2023-09-19 DIAGNOSIS — Z5321 Procedure and treatment not carried out due to patient leaving prior to being seen by health care provider: Secondary | ICD-10-CM | POA: Insufficient documentation

## 2023-09-20 ENCOUNTER — Other Ambulatory Visit: Payer: Self-pay

## 2023-09-20 ENCOUNTER — Encounter (HOSPITAL_COMMUNITY): Payer: Self-pay | Admitting: Pharmacy Technician

## 2023-09-20 ENCOUNTER — Emergency Department (HOSPITAL_COMMUNITY)

## 2023-09-20 ENCOUNTER — Emergency Department (HOSPITAL_COMMUNITY)
Admission: EM | Admit: 2023-09-20 | Discharge: 2023-09-20 | Disposition: A | Discharge status: Home / Self Care | Source: Home / Self Care | Attending: Emergency Medicine | Admitting: Emergency Medicine

## 2023-09-20 DIAGNOSIS — W268XXA Contact with other sharp object(s), not elsewhere classified, initial encounter: Secondary | ICD-10-CM | POA: Insufficient documentation

## 2023-09-20 DIAGNOSIS — Z23 Encounter for immunization: Secondary | ICD-10-CM | POA: Insufficient documentation

## 2023-09-20 DIAGNOSIS — S61212A Laceration without foreign body of right middle finger without damage to nail, initial encounter: Secondary | ICD-10-CM | POA: Insufficient documentation

## 2023-09-20 DIAGNOSIS — X58XXXA Exposure to other specified factors, initial encounter: Secondary | ICD-10-CM | POA: Diagnosis not present

## 2023-09-20 DIAGNOSIS — Y93G9 Activity, other involving cooking and grilling: Secondary | ICD-10-CM | POA: Diagnosis not present

## 2023-09-20 DIAGNOSIS — Z5321 Procedure and treatment not carried out due to patient leaving prior to being seen by health care provider: Secondary | ICD-10-CM | POA: Diagnosis not present

## 2023-09-20 MED ORDER — LIDOCAINE HCL (PF) 1 % IJ SOLN
INTRAMUSCULAR | Status: AC
  Filled 2023-09-20: qty 5

## 2023-09-20 MED ORDER — LIDOCAINE HCL (PF) 1 % IJ SOLN
5.0000 mL | Freq: Once | INTRAMUSCULAR | Status: AC
  Administered 2023-09-20: 5 mL

## 2023-09-20 MED ORDER — TETANUS-DIPHTH-ACELL PERTUSSIS 5-2.5-18.5 LF-MCG/0.5 IM SUSY
0.5000 mL | PREFILLED_SYRINGE | Freq: Once | INTRAMUSCULAR | Status: AC
  Administered 2023-09-20: 0.5 mL via INTRAMUSCULAR
  Filled 2023-09-20: qty 0.5

## 2023-09-20 NOTE — ED Provider Notes (Signed)
 [place] [facility]PARTMENT AT [facility] Provider Note   CSN: [id number] Arrival date & time: 09/20/23  1640     Patient presents with: Finger Injury   Shane Hicks is a 48 y.o. male.   HPI Patient presents for laceration to right third digit.  Yesterday evening, he accidentally cut his finger on a jagged piece of a metal grill.  He initially came to the ED but, due to prolonged wait time, he went home.  He has had ongoing bleeding today.  He denies any significant associated pain.  He is right-handed.    Prior to Admission medications   Medication Sig Start Date End Date Taking? Authorizing Provider  acetaminophen  (TYLENOL ) 325 MG tablet Take 2 tablets (650 mg total) by mouth every 6 (six) hours as needed for mild pain (or Fever >/= 101). 07/19/17   Marilee Leach, MD  famotidine  (PEPCID ) 20 MG tablet Take 1 tablet (20 mg total) by mouth daily for 14 days. 07/07/18 07/21/18  Caccavale, Sophia, PA-C  hydrocortisone  (ANUSOL -HC) 25 MG suppository Place 1 suppository (25 mg total) rectally 2 (two) times daily. 01/19/19   Vicky Charleston, PA-C    Allergies: Patient has no known allergies.    Review of Systems  Skin:  Positive for wound.  All other systems reviewed and are negative.   Updated Vital Signs BP (!) 148/117 (BP Location: Right Arm)   Pulse 81   Temp 99.6 F (37.6 C)   Resp 16   SpO2 96%   Physical Exam Vitals and nursing note reviewed.  Constitutional:      General: He is not in acute distress.    Appearance: Normal appearance. He is well-developed. He is not ill-appearing, toxic-appearing or diaphoretic.  HENT:     Head: Normocephalic and atraumatic.     Right Ear: External ear normal.     Left Ear: External ear normal.     Nose: Nose normal.     Mouth/Throat:     Mouth: Mucous membranes are moist.   Eyes:     Extraocular Movements: Extraocular movements intact.     Conjunctiva/sclera: Conjunctivae normal.    Cardiovascular:     Rate  and Rhythm: Normal rate and regular rhythm.  Pulmonary:     Effort: Pulmonary effort is normal. No respiratory distress.  Abdominal:     General: There is no distension.     Palpations: Abdomen is soft.   Musculoskeletal:        General: Signs of injury present. No swelling or deformity.     Cervical back: Normal range of motion and neck supple.   Skin:    General: Skin is warm and dry.     Coloration: Skin is not jaundiced or pale.   Neurological:     General: No focal deficit present.     Mental Status: He is alert and oriented to person, place, and time.   Psychiatric:        Mood and Affect: Mood normal.        Behavior: Behavior normal.     (all labs ordered are listed, but only abnormal results are displayed) Labs Reviewed - No data to display  EKG: None  Radiology: DG Finger Middle Right Result Date: 09/20/2023 CLINICAL DATA:  Skin laceration of the third digit, initial encounter EXAM: RIGHT MIDDLE FINGER 2+V COMPARISON:  None Available. FINDINGS: No acute fracture or dislocation is noted. Mild soft tissue irregularity is noted consistent with the given clinical history. IMPRESSION: No acute  bony abnormality noted. Electronically Signed   By: Oneil Devonshire M.D.   On: 09/20/2023 00:57     .Laceration Repair  Date/Time: 09/20/2023 6:57 PM  Performed by: Melvenia Motto, MD Authorized by: Melvenia Motto, MD   Consent:    Consent obtained:  Verbal   Consent given by:  Patient   Risks, benefits, and alternatives were discussed: yes     Risks discussed:  Pain and infection   Alternatives discussed:  No treatment Universal protocol:    Procedure explained and questions answered to patient or proxy's satisfaction: yes     Imaging studies available: yes     Patient identity confirmed:  Verbally with patient Anesthesia:    Anesthesia method:  Local infiltration   Local anesthetic:  Lidocaine  1% w/o epi Laceration details:    Location:  Finger   Finger location:  R long  finger   Length (cm):  1   Depth (mm):  2 Pre-procedure details:    Preparation:  Patient was prepped and draped in usual sterile fashion and imaging obtained to evaluate for foreign bodies Treatment:    Area cleansed with:  Povidone-iodine and saline   Amount of cleaning:  Standard Skin repair:    Repair method:  Sutures   Suture size:  4-0   Suture material:  Nylon   Suture technique:  Simple interrupted   Number of sutures:  2 Approximation:    Approximation:  Loose Repair type:    Repair type:  Simple Post-procedure details:    Dressing: Xeroform, Kerlix, and Coban.   Procedure completion:  Tolerated well, no immediate complications    Medications Ordered in the ED  Tdap (BOOSTRIX) injection 0.5 mL (0.5 mLs Intramuscular Given 09/20/23 1720)  lidocaine  (PF) (XYLOCAINE ) 1 % injection 5 mL (5 mLs Other Given 09/20/23 1824)                                    Medical Decision Making Risk Prescription drug management.   Patient presents for finger wound.  This occurred last night.  He was helping a friend clean a metal grill and was inadvertently cut by a jagged piece.  He came to the ED yesterday.  He stayed all night but left due to long wait times.  He did undergo an x-ray of his affected digit which did not show any evidence of foreign body.  On exam, patient is well-appearing.  Wound is located on distal pad of middle right finger.  It is hemostatic at this time.  His tetanus was updated.  He underwent repair, as per procedure note above.  He was discharged in stable condition.     Final diagnoses:  Laceration of right middle finger without foreign body without damage to nail, initial encounter    ED Discharge Orders     None          Melvenia Motto, MD 09/20/23 1858

## 2023-09-20 NOTE — ED Notes (Signed)
 Pt approached this NT and asked for the wait time. This NT explained that we do not have a specific wait time and I was able to advise Pt of his current time waiting and the longest wait time. This NT explained that we are trying to see all the patients in the ED and it may take some time. Pt advised this NT the he has been here for over 10 hours and will not wait any longer. Pt advised to stay in the ED for emergency care. Pt refused and walked out of the ED with family.

## 2023-09-20 NOTE — ED Provider Triage Note (Signed)
 Emergency Medicine Provider Triage Evaluation Note  Teven Mittman , a 48 y.o. male  was evaluated in triage.  Pt complains of laceration to the right second digit.  Was helping a friend clean a metal grill when a jagged piece of metal cut into his finger.  This happened yesterday evening, came to the emergency department but due to prolonged waiting he went home.  The laceration is still bleeding causing him to present to the emergency room today..  Review of Systems  Positive: Laceration Negative: Distal numbness paresthesia  Physical Exam  BP (!) 148/117 (BP Location: Right Arm)   Pulse 81   Temp 99.6 F (37.6 C)   Resp 16   SpO2 96%  Gen:   Awake, no distress   Resp:  Normal effort  MSK:   Moves extremities without difficulty  Other:  Between 1 to 2 cm laceration noted to the distal tip of the volar right second digit.  Bleeding is controlled with direct pressure.  Medical Decision Making  Medically screening exam initiated at 5:17 PM.  Appropriate orders placed.  Tommie Provence was informed that the remainder of the evaluation will be completed by another provider, this initial triage assessment does not replace that evaluation, and the importance of remaining in the ED until their evaluation is complete.  Will provide tetanus booster.  Laceration will need further repair.   Myriam Dorn [place], [place] 09/20/23 1719

## 2023-09-20 NOTE — Discharge Instructions (Signed)
 Stitches will need to be removed in 7 days.  If area becomes increasingly red, swollen, painful, or begins draining pus, please return to the emergency department.

## 2023-09-20 NOTE — ED Triage Notes (Signed)
 Pt here with lac to R middle finger while helping his friend move a grill. Pt came last night to be seen but left before being seen by provider. Xray done yesterday. Unknown last tetanus. Pt reports finger has been bleeding since.

## 2023-09-20 NOTE — ED Triage Notes (Signed)
 Patient presents with skin laceration at right middle finger sustained this evening while moving a grill , dressing applied at triage by RN .
